# Patient Record
Sex: Male | Born: 1995 | Hispanic: No | Marital: Married | Smoking: Current every day smoker
Health system: Southern US, Community
[De-identification: ages and names within clinical notes are randomized; demographics above are authoritative.]

## PROBLEM LIST (undated history)

## (undated) ENCOUNTER — Ambulatory Visit (HOSPITAL_COMMUNITY): Admission: EM | Payer: Self-pay

## (undated) DIAGNOSIS — E119 Type 2 diabetes mellitus without complications: Secondary | ICD-10-CM

## (undated) DIAGNOSIS — E669 Obesity, unspecified: Secondary | ICD-10-CM

## (undated) HISTORY — DX: Type 2 diabetes mellitus without complications: E11.9

---
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
## 2015-08-01 ENCOUNTER — Encounter (HOSPITAL_COMMUNITY): Payer: Self-pay | Admitting: Emergency Medicine

## 2015-08-01 ENCOUNTER — Emergency Department (INDEPENDENT_AMBULATORY_CARE_PROVIDER_SITE_OTHER)
Admission: EM | Admit: 2015-08-01 | Discharge: 2015-08-01 | Disposition: A | Discharge status: Home / Self Care | Payer: Self-pay | Source: Home / Self Care | Attending: Emergency Medicine | Admitting: Emergency Medicine

## 2015-08-01 DIAGNOSIS — R29898 Other symptoms and signs involving the musculoskeletal system: Secondary | ICD-10-CM

## 2015-08-01 DIAGNOSIS — G5692 Unspecified mononeuropathy of left upper limb: Secondary | ICD-10-CM

## 2015-08-01 DIAGNOSIS — M6289 Other specified disorders of muscle: Secondary | ICD-10-CM

## 2015-08-01 NOTE — ED Notes (Signed)
Left hand issue, no known injury.  Reports numbness, tingling, weakness in left forearm to fingertips.  Reports inability to make a fist, reports he is only able to move thumb.

## 2015-08-01 NOTE — Discharge Instructions (Signed)
Peripheral Neuropathy °Peripheral neuropathy is a type of nerve damage. It affects nerves that carry signals between the spinal cord and other parts of the body. These are called peripheral nerves. With peripheral neuropathy, one nerve or a group of nerves may be damaged.  °CAUSES  °Many things can damage peripheral nerves. For some people with peripheral neuropathy, the cause is unknown. Some causes include: °· Diabetes. This is the most common cause of peripheral neuropathy. °· Injury to a nerve. °· Pressure or stress on a nerve that lasts a long time. °· Too little vitamin B. Alcoholism can lead to this. °· Infections. °· Autoimmune diseases, such as multiple sclerosis and systemic lupus erythematosus. °· Inherited nerve diseases. °· Some medicines, such as cancer drugs. °· Toxic substances, such as lead and mercury. °· Too little blood flowing to the legs. °· Kidney disease. °· Thyroid disease. °SIGNS AND SYMPTOMS  °Different people have different symptoms. The symptoms you have will depend on which of your nerves is damaged.  Common symptoms include: °· Loss of feeling (numbness) in the feet and hands. °· Tingling in the feet and hands. °· Pain that burns. °· Very sensitive skin. °· Weakness. °· Not being able to move a part of the body (paralysis). °· Muscle twitching. °· Clumsiness or poor coordination. °· Loss of balance. °· Not being able to control your bladder. °· Feeling dizzy. °· Sexual problems. °DIAGNOSIS  °Peripheral neuropathy is a symptom, not a disease. Finding the cause of peripheral neuropathy can be hard. To figure that out, your health care provider will take a medical history and do a physical exam. A neurological exam will also be done. This involves checking things affected by your brain, spinal cord, and nerves (nervous system). For example, your health care provider will check your reflexes, how you move, and what you can feel.  °Other types of tests may also be ordered, such as: °· Blood  tests. °· A test of the fluid in your spinal cord. °· Imaging tests, such as CT scans or an MRI. °· Electromyography (EMG). This test checks the nerves that control muscles. °· Nerve conduction velocity tests. These tests check how fast messages pass through your nerves. °· Nerve biopsy. A small piece of nerve is removed. It is then checked under a microscope. °TREATMENT  °· Medicine is often used to treat peripheral neuropathy. Medicines may include: °¨ Pain-relieving medicines. Prescription or over-the-counter medicine may be suggested. °¨ Antiseizure medicine. This may be used for pain. °¨ Antidepressants. These also may help ease pain from neuropathy. °¨ Lidocaine. This is a numbing medicine. You might wear a patch or be given a shot. °¨ Mexiletine. This medicine is typically used to help control irregular heart rhythms. °· Surgery. Surgery may be needed to relieve pressure on a nerve or to destroy a nerve that is causing pain. °· Physical therapy to help movement. °· Assistive devices to help movement. °HOME CARE INSTRUCTIONS  °· Only take over-the-counter or prescription medicines as directed by your health care provider. Follow the instructions carefully for any given medicines. Do not take any other medicines without first getting approval from your health care provider. °· If you have diabetes, work closely with your health care provider to keep your blood sugar under control. °· If you have numbness in your feet: °¨ Check every day for signs of injury or infection. Watch for redness, warmth, and swelling. °¨ Wear padded socks and comfortable shoes. These help protect your feet. °· Do not do   things that put pressure on your damaged nerve.  Do not smoke. Smoking keeps blood from getting to damaged nerves.  Avoid or limit alcohol. Too much alcohol can cause a lack of B vitamins. These vitamins are needed for healthy nerves.  Develop a good support system. Coping with peripheral neuropathy can be  stressful. Talk to a mental health specialist or join a support group if you are struggling.  Follow up with your health care provider as directed. SEEK MEDICAL CARE IF:   You have new signs or symptoms of peripheral neuropathy.  You are struggling emotionally from dealing with peripheral neuropathy.  You have a fever. SEEK IMMEDIATE MEDICAL CARE IF:   You have an injury or infection that is not healing.  You feel very dizzy or begin vomiting.  You have chest pain.  You have trouble breathing. Document Released: 11/01/2002 Document Revised: 07/24/2011 Document Reviewed: 07/19/2013 [facility] Patient Information 2015 [place], [place]. This information is not intended to replace advice given to you by your health care provider. Make sure you discuss any questions you have with your health care provider.  Radial Nerve Palsy Wrist drop is also known as radial nerve palsy. It is a condition in which you can not extend your wrist. This means if you are standing with your elbow bent at a right angle and with the top of your hand pointed at the ceiling, you can not hold your hand up. It falls toward the floor.  This action of extending your wrist is caused by the muscles in the back of your arm. These muscles are controlled by the radial nerve. This means that anything affecting the radial nerve so it can not tell the muscles how to work will cause wrist drop. This is medically called radial nerve palsy. Also the radial nerve is a motor and sensory nerve so anything affecting it causes problems with movement and feeling. CAUSES  Some more common causes of wrist drop are:  A break (fracture) of the large bone in the arm between your shoulder and your elbow (humerus). This is because the radial nerve winds around the humerus.  Improper use of crutches causes this because the radial nerve runs through the armpit (axilla). Crutches which are too long can put pressure on the nerve. This is sometimes  called crutch palsy.  Falling asleep with your arm over a chair and supported on the back is a common cause. This is sometimes called Saturday Night Syndrome.  Wrist drop can be associated with lead poisoning because of the effect of lead on the radial nerve. SYMPTOMS  The wrist drop is an obvious problem, but there may also be numbness in the back of the arm, forearm or hand which provides feeling in these areas by the radial nerve. There can be difficulty straightening out the elbow in addition to the wrist. There may be numbness, tingling, pain, burning sensations or other abnormal feelings. Symptoms depend entirely on where the radial nerve is injured. DIAGNOSIS   Wrist drop is obvious just by looking at it. Your caregiver may make the diagnosis by taking your history and doing a couple tests.  One test which may be done is a nerve conduction study. This test shows if the radial nerve is conducting signals well. If not, it can determine where the nerve problem is.  Sometimes X-ray studies are done. Your caregiver will determine if further testing needs to be done. TREATMENT   Usually if the problem is found to be  pressure on the nerve, simply removing the pressure will allow the nerve to go back to normal in a few weeks to a few months. Other treatments will depend upon the cause found.  Only take over-the-counter or prescription medicines for pain, discomfort, or fever as directed by your caregiver.  Sometimes seizure medications are used.  Steroids are sometimes given to decrease swelling if it is thought to be a possible cause. Document Released: 07/18/2006 Document Revised: 02/03/2012 Document Reviewed: 01/26/2014 [facility] Patient Information 2015 [place], [place]. This information is not intended to replace advice given to you by your health care provider. Make sure you discuss any questions you have with your health care provider.

## 2015-08-01 NOTE — ED Provider Notes (Signed)
CSN: [id number]     Arrival date & time 08/01/15  1301 History   First MD Initiated Contact with Patient 08/01/15 1333     Chief Complaint  Patient presents with  . Hand Problem   (Consider location/radiation/quality/duration/timing/severity/associated sxs/prior Treatment) HPI Comments: 19 year old male complaining of left hand weakness and occasional numbness. He states that approximately 4 months ago he started developing numbness in his hand and lesser to his fingers. Last night as he was sleeping he awoke and had numbness in his left arm and was unable to flex his fingers to make a fist. He is still unable to do that this morning. He felt as though his arm went numb and became weak as he was sleeping on his arm last night. He is also experienced similar symptoms to his right upper extremity. He states he is not working because he quit his job due to left hand weakness. Denies any known injury. No trauma. He also states that it is worse when he is driving with his left hand.   History reviewed. No pertinent past medical history. History reviewed. No pertinent past surgical history. No family history on file. Social History  Substance Use Topics  . Smoking status: Current Some Day Smoker -- 4.00 packs/day    Types: Cigarettes  . Smokeless tobacco: None  . Alcohol Use: None     Comment: occasionally drinking    Review of Systems  Constitutional: Positive for activity change. Negative for fever and fatigue.  HENT: Negative.   Respiratory: Negative.   Musculoskeletal:       As per history of present illness  Skin: Negative for color change, pallor and rash.  Neurological: Positive for numbness. Negative for syncope and speech difficulty.  All other systems reviewed and are negative.   Allergies  Review of patient's allergies indicates no known allergies.  Home Medications   Prior to Admission medications   Not on File   Meds Ordered and Administered this Visit  Medications -  No data to display  BP 132/89 mmHg  Pulse 109  Temp(Src) 97.9 F (36.6 C) (Oral)  Resp 16  SpO2 97% No data found.   Physical Exam  Constitutional: He appears well-developed and well-nourished. No distress.  Neck: Normal range of motion. Neck supple.  Pulmonary/Chest: Effort normal. No respiratory distress.  Musculoskeletal: He exhibits no edema.  Strength of the left arm and wrist is 5 over 5. Generally weak area are his fingers in which she has weakness in making a fist.  left hand with normal warmth and color. No pallor. Capillary refill is brisk. There is no atrophy of the left forearm wrist or hand. Radial pulse is 2+.  Negative Phalen's negative Tinel's. No tenderness to the forearm although occasionally pain will radiate to the ulnar aspect of the forearm.  There is tenderness to the ulnar aspect of the wrist. No swelling, deformity, discoloration or other recognizable abnormality.  Neurological: He is alert. He exhibits normal muscle tone.  Skin: Skin is warm and dry.  Psychiatric: He has a normal mood and affect.  Nursing note and vitals reviewed.   ED Course  Procedures (including critical care time)  Labs Review Labs Reviewed - No data to display  Imaging Review No results found.   Visual Acuity Review  Right Eye Distance:   Left Eye Distance:   Bilateral Distance:    Right Eye Near:   Left Eye Near:    Bilateral Near:  MDM   1. Left hand weakness   2. Neuropathy of hand, left    Uncertain as to the etiology of the gradual onset of the weakness and pain to the left distal upper extremity. There are signs and symptoms of tendinitis as well as "Saturday night palsy". He frequently sleeps on his side and therefore his arm and will often awake with a distal pressure neuropathy. This apparently occurred last night and his description is typical of this type of condition. Very little evidence for carpal tunnel syndrome. We will have him apply a wrist  splint. He may remove it periodically and exercises fingers and wrist. Follow-up with urologist as needed. Also instructed to obtain a primary care physician.  Hayden Rasmussen, NP 08/01/15 1359

## 2015-12-12 ENCOUNTER — Encounter (HOSPITAL_COMMUNITY): Payer: Self-pay | Admitting: Emergency Medicine

## 2015-12-12 ENCOUNTER — Emergency Department (HOSPITAL_COMMUNITY)
Admission: EM | Admit: 2015-12-12 | Discharge: 2015-12-12 | Disposition: A | Discharge status: Home / Self Care | Payer: Medicaid Other | Attending: Emergency Medicine | Admitting: Emergency Medicine

## 2015-12-12 DIAGNOSIS — M542 Cervicalgia: Secondary | ICD-10-CM

## 2015-12-12 DIAGNOSIS — M25511 Pain in right shoulder: Secondary | ICD-10-CM | POA: Insufficient documentation

## 2015-12-12 DIAGNOSIS — M546 Pain in thoracic spine: Secondary | ICD-10-CM | POA: Insufficient documentation

## 2015-12-12 DIAGNOSIS — F17210 Nicotine dependence, cigarettes, uncomplicated: Secondary | ICD-10-CM | POA: Insufficient documentation

## 2015-12-12 MED ORDER — METHOCARBAMOL 500 MG PO TABS: 500.0000 mg | ORAL_TABLET | Freq: Two times a day (BID) | ORAL | Status: DC | PRN

## 2015-12-12 MED ORDER — NAPROXEN 250 MG PO TABS: 250.0000 mg | ORAL_TABLET | Freq: Two times a day (BID) | ORAL | Status: DC

## 2015-12-12 NOTE — ED Provider Notes (Signed)
CSN: [id number]     Arrival date & time 12/12/15  0704 History   First MD Initiated Contact with Patient 12/12/15 0720     Chief Complaint  Patient presents with  . Neck Pain  . Back Pain   Alexander Mccann is a 20 y.o. male who presents to the emergency department complaining of right-sided shoulder and neck pain ongoing for the past 2 days. Patient denies any injury or trauma. He reports he began having pain yesterday and his pain is worse with movement of his neck and arm. He complains of 3 out of 10 pain currently which is worse with movement. He has taken nothing for treatment today. He denies any falls or injury. He denies fevers, numbness, tingling, weakness, headache, sore throat, trouble swallowing.   (Consider location/radiation/quality/duration/timing/severity/associated sxs/prior Treatment) HPI  History reviewed. No pertinent past medical history. History reviewed. No pertinent past surgical history. History reviewed. No pertinent family history. Social History  Substance Use Topics  . Smoking status: Current Some Day Smoker -- 4.00 packs/day    Types: Cigarettes  . Smokeless tobacco: None  . Alcohol Use: None     Comment: occasionally drinking    Review of Systems  Constitutional: Negative for fever and chills.  HENT: Negative for congestion, sore throat and trouble swallowing.   Eyes: Negative for visual disturbance.  Respiratory: Negative for cough and shortness of breath.   Cardiovascular: Negative for chest pain and palpitations.  Gastrointestinal: Negative for nausea, vomiting and abdominal pain.  Musculoskeletal: Positive for neck pain. Negative for back pain.  Skin: Negative for rash and wound.  Neurological: Negative for weakness, light-headedness, numbness and headaches.      Allergies  Review of patient's allergies indicates no known allergies.  Home Medications   Prior to Admission medications   Medication Sig Start Date End Date Taking? Authorizing  Provider  methocarbamol (ROBAXIN) 500 MG tablet Take 1 tablet (500 mg total) by mouth 2 (two) times daily as needed for muscle spasms. 12/12/15   Everlene Farrier, PA-C  naproxen (NAPROSYN) 250 MG tablet Take 1 tablet (250 mg total) by mouth 2 (two) times daily with a meal. 12/12/15   Everlene Farrier, PA-C   BP 158/104 mmHg  Pulse 86  Temp(Src) 98.2 F (36.8 C) (Oral)  Resp 18  SpO2 97% Physical Exam  Constitutional: He appears well-developed and well-nourished. No distress.  Nontoxic appearing.  HENT:  Head: Normocephalic and atraumatic.  Right Ear: External ear normal.  Left Ear: External ear normal.  Mouth/Throat: Oropharynx is clear and moist. No oropharyngeal exudate.  Bilateral tympanic membranes are pearly-gray without erythema or loss of landmarks.  No tonsillar hypertrophy or exudates. Throat is clear.  Eyes: Conjunctivae are normal. Pupils are equal, round, and reactive to light. Right eye exhibits no discharge. Left eye exhibits no discharge.  Neck: Normal range of motion. Neck supple. No JVD present. No tracheal deviation present.  He has good range of motion of his neck. He has tenderness along his right trapezius muscle. He is able to place his chin to his chest and look up to the ceiling. He is able to turn his head greater than 45 in each direction. No meningeal signs. No midline neck tenderness. No crepitus or deformity.  Cardiovascular: Normal rate, regular rhythm, normal heart sounds and intact distal pulses.  Exam reveals no gallop and no friction rub.   No murmur heard. Bilateral radial pulses are intact.  Pulmonary/Chest: Effort normal and breath sounds normal. No respiratory distress. He  has no wheezes. He has no rales.  Abdominal: Soft. There is no tenderness.  Musculoskeletal: Normal range of motion. He exhibits no edema or tenderness.  Patient is getting normal range of motion of his bilateral upper extremities. No bony point tenderness. No midline back tenderness.  He is able to ambulate with normal gait. 5 out of 5 strength in his bilateral upper extremities.  Lymphadenopathy:    He has no cervical adenopathy.  Neurological: He is alert. Coordination normal.  Sensation is intact his bilateral upper extremities. Normal gait.  Skin: Skin is warm and dry. No rash noted. He is not diaphoretic. No erythema. No pallor.  Psychiatric: He has a normal mood and affect. His behavior is normal.  Nursing note and vitals reviewed.   ED Course  Procedures (including critical care time) Labs Review Labs Reviewed - No data to display  Imaging Review No results found.    EKG Interpretation None      Filed Vitals:   12/12/15 0716  BP: 158/104  Pulse: 86  Temp: 98.2 F (36.8 C)  TempSrc: Oral  Resp: 18  SpO2: 97%     MDM   Meds given in ED:  Medications - No data to display  Discharge Medication List as of 12/12/2015  7:30 AM    START taking these medications   Details  methocarbamol (ROBAXIN) 500 MG tablet Take 1 tablet (500 mg total) by mouth 2 (two) times daily as needed for muscle spasms., Starting 12/12/2015, Until Discontinued, Print    naproxen (NAPROSYN) 250 MG tablet Take 1 tablet (250 mg total) by mouth 2 (two) times daily with a meal., Starting 12/12/2015, Until Discontinued, Print        Final diagnoses:  Neck pain on right side   This  is a 20 y.o. male who presents to the emergency department complaining of right-sided shoulder and neck pain ongoing for the past 2 days. Patient denies any injury or trauma. He reports he began having pain yesterday and his pain is worse with movement of his neck and arm. He complains of 3 out of 10 pain currently which is worse with movement. On exam the patient is afebrile nontoxic appearing. He has no focal neurological deficits. He has tenderness over his right trapezius muscle. Patient with neck muscle sprain. Will start the patient on Robaxin and naproxen and have him follow up closely with  primary care. No meningeal signs. No need for x-ray at this time. I advised the patient to follow-up with their primary care provider this week. I advised the patient to return to the emergency department with new or worsening symptoms or new concerns. The patient verbalized understanding and agreement with plan.       Everlene Farrier, PA-C 12/12/15 8119  Mancel Bale, MD 12/12/15 [phone]

## 2015-12-12 NOTE — Discharge Instructions (Signed)
Acute Torticollis °Torticollis is a condition in which the muscles of the neck tighten (contract) abnormally, causing the neck to twist and the head to move into an unnatural position. Torticollis that develops suddenly is called acute torticollis. If torticollis becomes chronic and is left untreated, the face and neck can become deformed. °CAUSES °This condition may be caused by: °· Sleeping in an awkward position (common). °· Extending or twisting the neck muscles beyond their normal position. °· Infection. °In some cases, the cause may not be known. °SYMPTOMS °Symptoms of this condition include: °· An unnatural position of the head. °· Neck pain. °· A limited ability to move the neck. °· Twisting of the neck to one side. °DIAGNOSIS °This condition is diagnosed with a physical exam. You may also have imaging tests, such as an X-ray, CT scan, or MRI. °TREATMENT °Treatment for this condition involves trying to relax the neck muscles. It may include: °· Medicines or shots. °· Physical therapy. °· Surgery. This may be done in severe cases. °HOME CARE INSTRUCTIONS °· Take medicines only as directed by your health care provider. °· Do stretching exercises and massage your neck as directed by your health care provider. °· Keep all follow-up visits as directed by your health care provider. This is important. °SEEK MEDICAL CARE IF: °· You develop a fever. °SEEK IMMEDIATE MEDICAL CARE IF: °· You develop difficulty breathing. °· You develop noisy breathing (stridor). °· You start drooling. °· You have trouble swallowing or have pain with swallowing. °· You develop numbness or weakness in your hands or feet. °· You have changes in your speech, understanding, or vision. °· Your pain gets worse. °  °This information is not intended to replace advice given to you by your health care provider. Make sure you discuss any questions you have with your health care provider. °  °Document Released: 11/08/2000 Document Revised:  03/28/2015 Document Reviewed: 11/07/2014 °Elsevier Interactive Patient Education ©2016 [employer] ° °

## 2015-12-12 NOTE — ED Notes (Signed)
Pt sts right sided upper back and neck pain x 2 days worse with movement; pt denies obvious injury

## 2016-09-20 ENCOUNTER — Encounter (HOSPITAL_COMMUNITY): Payer: Self-pay | Admitting: Emergency Medicine

## 2016-09-20 DIAGNOSIS — W108XXA Fall (on) (from) other stairs and steps, initial encounter: Secondary | ICD-10-CM | POA: Insufficient documentation

## 2016-09-20 DIAGNOSIS — Y9389 Activity, other specified: Secondary | ICD-10-CM | POA: Insufficient documentation

## 2016-09-20 DIAGNOSIS — F17210 Nicotine dependence, cigarettes, uncomplicated: Secondary | ICD-10-CM | POA: Insufficient documentation

## 2016-09-20 DIAGNOSIS — S300XXA Contusion of lower back and pelvis, initial encounter: Secondary | ICD-10-CM | POA: Insufficient documentation

## 2016-09-20 DIAGNOSIS — Y999 Unspecified external cause status: Secondary | ICD-10-CM | POA: Insufficient documentation

## 2016-09-20 DIAGNOSIS — Y92009 Unspecified place in unspecified non-institutional (private) residence as the place of occurrence of the external cause: Secondary | ICD-10-CM | POA: Insufficient documentation

## 2016-09-20 NOTE — ED Triage Notes (Signed)
Pt. fell today at home , reports pain at lower back , denies LOC / ambulatory , no  hematuria or dysuria .

## 2016-09-21 ENCOUNTER — Emergency Department (HOSPITAL_COMMUNITY)
Admission: EM | Admit: 2016-09-21 | Discharge: 2016-09-21 | Disposition: A | Discharge status: Home / Self Care | Payer: Self-pay | Attending: Emergency Medicine | Admitting: Emergency Medicine

## 2016-09-21 ENCOUNTER — Emergency Department (HOSPITAL_COMMUNITY): Payer: Self-pay

## 2016-09-21 DIAGNOSIS — S300XXA Contusion of lower back and pelvis, initial encounter: Secondary | ICD-10-CM

## 2016-09-21 DIAGNOSIS — M4310 Spondylolisthesis, site unspecified: Secondary | ICD-10-CM

## 2016-09-21 HISTORY — DX: Obesity, unspecified: E66.9

## 2016-09-21 MED ORDER — METHOCARBAMOL 500 MG PO TABS: 500.0000 mg | ORAL_TABLET | Freq: Two times a day (BID) | ORAL | 0 refills | Status: DC | PRN

## 2016-09-21 MED ORDER — CYCLOBENZAPRINE HCL 10 MG PO TABS: 10.0000 mg | ORAL_TABLET | Freq: Two times a day (BID) | ORAL | 0 refills | Status: DC | PRN

## 2016-09-21 MED ORDER — IBUPROFEN 400 MG PO TABS
800.0000 mg | ORAL_TABLET | Freq: Once | ORAL | Status: AC
  Administered 2016-09-21: 800 mg via ORAL
  Filled 2016-09-21: qty 2

## 2016-09-21 MED ORDER — NAPROXEN 250 MG PO TABS: 250.0000 mg | ORAL_TABLET | Freq: Two times a day (BID) | ORAL | 0 refills | Status: DC

## 2016-09-21 NOTE — ED Provider Notes (Signed)
[facility]-EMERGENCY DEPT Provider Note   CSN: [id number] Arrival date & time: 09/20/16  2145     History   Chief Complaint Chief Complaint  Patient presents with  . Back Pain    HPI Alexander Mccann is a 20 y.o. male who presents to the ED with low back pain. 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 He landed on his back and has continued to have pain in the lower back. He denies other injuries from the fall.   HPI  Past Medical History:  Diagnosis Date  . Obesity     Patient Active Problem List   Diagnosis Date Noted  . VIRAL INFECTION 01/19/2008  . ACUTE PHARYNGITIS 01/19/2008  . MORBID OBESITY 01/08/2008  . INGROWN TOENAIL 01/08/2008  . TINEA VERSICOLOR 08/04/2007  . HYPOGONADISM 08/04/2007  . BRONCHITIS NOS 08/04/2007    History reviewed. No pertinent surgical history.     Home Medications    Prior to Admission medications   Medication Sig Start Date End Date Taking? Authorizing Provider  cyclobenzaprine (FLEXERIL) 10 MG tablet Take 1 tablet (10 mg total) by mouth 2 (two) times daily as needed for muscle spasms. 09/21/16   Hope Orlene OchM Neese, NP  methocarbamol (ROBAXIN) 500 MG tablet Take 1 tablet (500 mg total) by mouth 2 (two) times daily as needed for muscle spasms. 09/21/16   Marlon Peliffany Greene, PA-C  naproxen (NAPROSYN) 250 MG tablet Take 1 tablet (250 mg total) by mouth 2 (two) times daily with a meal. 09/21/16   Marlon Peliffany Greene, PA-C    Family History No family history on file.  Social History Social History  Substance Use Topics  . Smoking status: Current Some Day Smoker    Types: Cigarettes  . Smokeless tobacco: Never Used  . Alcohol use Not on file     Comment: occasionally drinking     Allergies   Review of patient's allergies indicates no known allergies.   Review of Systems Review of Systems  HENT: Negative.   Eyes: Negative for visual disturbance.  Respiratory: Positive for cough.   Gastrointestinal: Negative for  abdominal pain, nausea and vomiting.  Genitourinary: Negative for dysuria, frequency and hematuria.  Musculoskeletal: Positive for back pain. Negative for neck pain.  Skin: Negative for wound.  Neurological: Negative for syncope and headaches.  Psychiatric/Behavioral: Negative for confusion.     Physical Exam Updated Vital Signs BP (!) 142/101 (BP Location: Right Arm)   Pulse 92   Temp 98.6 F (37 C) (Oral)   Resp 18   Ht 5\' 7"  (1.702 m)   Wt 132.9 kg   SpO2 97%   BMI 45.89 kg/m   Physical Exam  Constitutional: He is oriented to person, place, and time. He appears well-developed and well-nourished. No distress.  HENT:  Head: Normocephalic and atraumatic.  Eyes: EOM are normal. Pupils are equal, round, and reactive to light.  Neck: Normal range of motion. Neck supple.  Cardiovascular: Normal rate and regular rhythm.   Pulmonary/Chest: Effort normal. No respiratory distress. He has no wheezes. He has no rales.  Abdominal: Soft. Bowel sounds are normal. There is no tenderness.  Musculoskeletal: Normal range of motion. He exhibits no edema.       Lumbar back: He exhibits tenderness. He exhibits normal range of motion, no deformity, no spasm and normal pulse.  Neurological: He is alert and oriented to person, place, and time. He has normal strength. No cranial nerve deficit or sensory deficit. Coordination and gait normal.  Reflex Scores:      Bicep reflexes are 2+ on the right side and 2+ on the left side.      Brachioradialis reflexes are 2+ on the right side and 2+ on the left side.      Patellar reflexes are 2+ on the right side and 2+ on the left side.      Achilles reflexes are 2+ on the right side and 2+ on the left side. Skin: Skin is warm and dry.  Psychiatric: He has a normal mood and affect. His behavior is normal.  Nursing note and vitals reviewed.    ED Treatments / Results  Labs (all labs ordered are listed, but only abnormal results are displayed) Labs  Reviewed - No data to display   Radiology Dg Lumbar Spine Complete  Result Date: 09/21/2016 CLINICAL DATA:  20 year old male with fall and lower back pain. EXAM: LUMBAR SPINE - COMPLETE 4+ VIEW COMPARISON:  None. FINDINGS: No definite acute fracture. There is grade II L5-S1 retrolisthesis. No definite pars defects identified on these radiographs but findings may be related to pars defects. CT or MRI is recommended if there is clinical concern for an acute injury. The vertebral body heights and disc spaces (proximal to L5-S1) are maintained. The visualized transverse and spinous processes appear intact. The soft tissues are grossly unremarkable. IMPRESSION: No definite acute fracture. Grade II L5-S1 retrolisthesis. Clinical correlation is recommended. CT or MRI may provide better evaluation if there is clinical concern for an acute injury. Electronically Signed   By: Elgie CollardArash  Radparvar M.D.   On: 09/21/2016 02:48    Procedures Procedures (including critical care time)  Medications Ordered in ED Medications  ibuprofen (ADVIL,MOTRIN) tablet 800 mg (800 mg Oral Given 09/21/16 0036)     Initial Impression / Assessment and Plan / ED Course  I have reviewed the triage vital signs and the nursing notes.  Clinical Course  20 y.o. male with back pain s/p fall stable for d/c to f/u with ortho for further evaluation of back pain and retrolisthesis. No neuro deficits.  Final Clinical Impressions(s) / ED Diagnoses   Final diagnoses:  Contusion of lower back, initial encounter  Retrolisthesis    New Prescriptions Discharge Medication List as of 09/21/2016  3:04 AM    START taking these medications   Details  cyclobenzaprine (FLEXERIL) 10 MG tablet Take 1 tablet (10 mg total) by mouth 2 (two) times daily as needed for muscle spasms., Starting Sat 09/21/2016, Print         [place] Ne[place], NP 09/22/16 1547    Gilda Creasehristopher J Pollina, MD 09/23/16 [phone]

## 2017-06-08 ENCOUNTER — Encounter (HOSPITAL_COMMUNITY): Payer: Self-pay | Admitting: *Deleted

## 2017-06-08 ENCOUNTER — Emergency Department (HOSPITAL_COMMUNITY)
Admission: EM | Admit: 2017-06-08 | Discharge: 2017-06-08 | Disposition: A | Discharge status: Home / Self Care | Payer: Medicaid Other | Attending: Emergency Medicine | Admitting: Emergency Medicine

## 2017-06-08 DIAGNOSIS — R030 Elevated blood-pressure reading, without diagnosis of hypertension: Secondary | ICD-10-CM | POA: Insufficient documentation

## 2017-06-08 DIAGNOSIS — F17210 Nicotine dependence, cigarettes, uncomplicated: Secondary | ICD-10-CM | POA: Insufficient documentation

## 2017-06-08 DIAGNOSIS — B078 Other viral warts: Secondary | ICD-10-CM | POA: Insufficient documentation

## 2017-06-08 DIAGNOSIS — R0789 Other chest pain: Secondary | ICD-10-CM

## 2017-06-08 DIAGNOSIS — Z79899 Other long term (current) drug therapy: Secondary | ICD-10-CM | POA: Insufficient documentation

## 2017-06-08 DIAGNOSIS — G8929 Other chronic pain: Secondary | ICD-10-CM | POA: Insufficient documentation

## 2017-06-08 NOTE — ED Provider Notes (Signed)
[facility]-EMER[facility]Y DEPT Provider Note   CSN: [id number] Arrival date & time: 06/08/17  [id number]     History   Chief Complaint Chief Complaint  Patient presents with  . Back Pain  . Chest Pain    HPI Alexander Mccann is a 21 y.o. male.Complains of low back pain, nonradiating at paralumbar area for one year. In the past he's treated back pain with topical ointment and with a back brace with partial relief he has not used either the ointment or the back brace recently. He states "I don't like to take pills" pain is worse with bending improved with remaining still. Nonradiating. No fever no loss of bladder or bowel control. He also complains of anterior chest pain covering a 1 cm area over his left anterior chest for 6 months lasting 5 minutes at a time he states is worse with drinking Coke and improved with drinking Sprite he has no shortness of breath no nausea no sweatiness pain is not made worse with exertion. No other associated symptoms. He also reports warts on his hands for one month for which she is requesting medications. No symptoms presently.  HPI  Past Medical History:  Diagnosis Date  . Obesity     Patient Active Problem List   Diagnosis Date Noted  . VIRAL INFECTION 01/19/2008  . ACUTE PHARYNGITIS 01/19/2008  . MORBID OBESITY 01/08/2008  . INGROWN TOENAIL 01/08/2008  . TINEA VERSICOLOR 08/04/2007  . HYPOGONADISM 08/04/2007  . BRONCHITIS NOS 08/04/2007    History reviewed. No pertinent surgical history.     Home Medications    Prior to Admission medications   Medication Sig Start Date End Date Taking? Authorizing Provider  cyclobenzaprine (FLEXERIL) 10 MG tablet Take 1 tablet (10 mg total) by mouth 2 (two) times daily as needed for muscle spasms. 09/21/16   Janne NapoleonNeese, Hope M, NP  methocarbamol (ROBAXIN) 500 MG tablet Take 1 tablet (500 mg total) by mouth 2 (two) times daily as needed for muscle spasms. 09/21/16   Marlon PelGreene, Tiffany, PA-C  naproxen (NAPROSYN) 250 MG tablet  Take 1 tablet (250 mg total) by mouth 2 (two) times daily with a meal. 09/21/16   Marlon PelGreene, Tiffany, PA-C    Family History No family history on file. Family history negative for cardiac disease Social History Social History  Substance Use Topics  . Smoking status: Current Some Day Smoker    Packs/day: 1.50    Types: Cigarettes  . Smokeless tobacco: Never Used  . Alcohol use Not on file     Comment: occasionally drinking     Allergies   Patient has no known allergies.   Review of Systems Review of Systems  Cardiovascular: Positive for chest pain.  Musculoskeletal: Positive for back pain.  Skin:       Warts on hands  All other systems reviewed and are negative.    Physical Exam Updated Vital Signs BP (!) 142/83   Pulse 82   Temp 98 F (36.7 C) (Oral)   Resp 19   Ht 5\' 8"  (1.727 m)   Wt 127 kg (280 lb)   SpO2 97%   BMI 42.57 kg/m   Physical Exam  Constitutional: He appears well-developed and well-nourished.  HENT:  Head: Normocephalic and atraumatic.  Eyes: Pupils are equal, round, and reactive to light. Conjunctivae are normal.  Neck: Neck supple. No tracheal deviation present. No thyromegaly present.  Cardiovascular: Normal rate, regular rhythm, normal heart sounds and intact distal pulses.   No murmur heard. Pulmonary/Chest: Effort normal  and breath sounds normal.  Abdominal: Soft. Bowel sounds are normal. He exhibits no distension. There is no tenderness.  Obese  Musculoskeletal: Normal range of motion. He exhibits no edema or tenderness.  Neurological: He is alert. Coordination normal.  Skin: Skin is warm and dry. Capillary refill takes less than 2 seconds. No rash noted.  Tiny warts on several fingers of both hands. Otherwise no rash  Psychiatric: He has a normal mood and affect.  Nursing note and vitals reviewed.    ED Treatments / Results  Labs (all labs ordered are listed, but only abnormal results are displayed) Labs Reviewed - No data to  display  EKG  EKG Interpretation  Date/Time:  Sunday June 08 2017 07:38:58 EDT Ventricular Rate:  83 PR Interval:  150 QRS Duration: 102 QT Interval:  368 QTC Calculation: 432 R Axis:   16 Text Interpretation:  Normal sinus rhythm Incomplete right bundle branch block Borderline ECG No old tracing to compare Confirmed by Basehor, Doreatha Martin [phone]) on 06/08/2017 8:00:52 AM       Radiology No results found.  Procedures Procedures (including critical care time)  Medications Ordered in ED Medications - No data to display   Initial Impression / Assessment and Plan / ED Course  I have reviewed the triage vital signs and the nursing notes.  Pertinent labs & imaging results that were available during my care of the patient were reviewed by me and considered in my medical decision making (see chart for details).     Chest pain is highly atypical for cardiac disease. Highly atypical for pulmonary embolism or other serious ailments he is asymptomatic presently. Suggest Tylenol for pain. I see hot for back pain. Topical over-the-counter medication for warts. I've referred him to primary care. I counseled patient for 5 minutes on smoking cessation. Blood pressure recheck 3 weeks  Final Clinical Impressions(s) / ED Diagnoses  Diagnosis #1 chronic back pain #2 atypical chest pain #3warts #4 tobacco abuse #5 elevated blood pressure Final diagnoses:  Atypical chest pain    New Prescriptions New Prescriptions   No medications on file     Doug Sou, MD 06/08/17 [phone]

## 2017-06-08 NOTE — ED Triage Notes (Signed)
Pt with multiple complaints.  Initially stated lower back pain that he was tx for 1 year ago and was told to go  "get a shot", but he didn't go.  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

## 2017-06-08 NOTE — Discharge Instructions (Signed)
Take Tylenol for pain. Ask the your local pharmacy for over-the-counter medication that he can place on the warts on your hands. Compound W is a possibility You can use icy hot on your back as needed. Call any of the numbers on these discharge instructions to get a primary care physician. Ask your new primary  care physician to help you to stop smoking as smoking can lead to heart disease lung disease and cancer. Get your blood pressure recheck within the next 3 weeks. Today's was mildly elevated at 142/83

## 2017-09-02 DIAGNOSIS — R0602 Shortness of breath: Secondary | ICD-10-CM | POA: Insufficient documentation

## 2017-09-02 DIAGNOSIS — R072 Precordial pain: Secondary | ICD-10-CM | POA: Insufficient documentation

## 2017-09-02 DIAGNOSIS — Z87891 Personal history of nicotine dependence: Secondary | ICD-10-CM | POA: Insufficient documentation

## 2017-09-02 DIAGNOSIS — R739 Hyperglycemia, unspecified: Secondary | ICD-10-CM | POA: Insufficient documentation

## 2017-09-03 ENCOUNTER — Emergency Department (HOSPITAL_COMMUNITY)
Admission: EM | Admit: 2017-09-03 | Discharge: 2017-09-03 | Disposition: A | Discharge status: Home / Self Care | Payer: Self-pay | Attending: Emergency Medicine | Admitting: Emergency Medicine

## 2017-09-03 ENCOUNTER — Emergency Department (HOSPITAL_COMMUNITY): Payer: Self-pay

## 2017-09-03 ENCOUNTER — Encounter (HOSPITAL_COMMUNITY): Payer: Self-pay | Admitting: Emergency Medicine

## 2017-09-03 DIAGNOSIS — R739 Hyperglycemia, unspecified: Secondary | ICD-10-CM

## 2017-09-03 DIAGNOSIS — R072 Precordial pain: Secondary | ICD-10-CM

## 2017-09-03 LAB — CBC
HEMATOCRIT: 38.6 % — AB (ref 39.0–52.0)
HEMOGLOBIN: 13.2 g/dL (ref 13.0–17.0)
MCH: 28 pg (ref 26.0–34.0)
MCHC: 34.2 g/dL (ref 30.0–36.0)
MCV: 81.8 fL (ref 78.0–100.0)
Platelets: 226 10*3/uL (ref 150–400)
RBC: 4.72 MIL/uL (ref 4.22–5.81)
RDW: 13.2 % (ref 11.5–15.5)
WBC: 8.4 10*3/uL (ref 4.0–10.5)

## 2017-09-03 LAB — BASIC METABOLIC PANEL
ANION GAP: 7 (ref 5–15)
BUN: 10 mg/dL (ref 6–20)
CHLORIDE: 101 mmol/L (ref 101–111)
CO2: 26 mmol/L (ref 22–32)
Calcium: 8.9 mg/dL (ref 8.9–10.3)
Creatinine, Ser: 0.67 mg/dL (ref 0.61–1.24)
GFR calc Af Amer: >60 mL/min (ref >60)
GFR calc non Af Amer: >60 mL/min (ref >60)
GLUCOSE: 457 mg/dL — AB (ref 65–99)
POTASSIUM: 3.9 mmol/L (ref 3.5–5.1)
Sodium: 134 mmol/L — ABNORMAL LOW (ref 135–145)

## 2017-09-03 LAB — I-STAT TROPONIN, ED: Troponin i, poc: 0 ng/mL (ref 0.00–0.08)

## 2017-09-03 LAB — CBG MONITORING, ED: GLUCOSE-CAPILLARY: 390 mg/dL — AB (ref 65–99)

## 2017-09-03 NOTE — Discharge Instructions (Signed)

## 2017-09-03 NOTE — ED Triage Notes (Signed)
Patient here with recurrent chest pain, states that the last time he was here he was told to quit smoking which he has and still continues with chest pain.  Patient states that it travels around his chest, sharp in nature.

## 2017-09-03 NOTE — ED Provider Notes (Signed)
[facility]-EMERGENCY DEPT Provider Note   CSN: [id number] Arrival date & time: 09/02/17  2341     History   Chief Complaint Chief Complaint  Patient presents with  . Chest Pain    HPI Alexander Mccann is a 21 y.o. male.  The history is provided by the patient.  Chest Pain   This is a new problem. The current episode started more than 2 days ago. The problem occurs daily. The problem has been gradually improving. The pain is moderate. The quality of the pain is described as burning. The pain does not radiate. Associated symptoms include shortness of breath. Pertinent negatives include no fever and no vomiting. He has tried nothing for the symptoms. Risk factors include obesity.   Patient with h/o obesity presents with chest pain He reports over past 3 days he has had diffuse chest pain, feels like hearburn at times It comes at random, not provoked by exertion  He has sharp CP at times He reports mild SOB No hemoptysis No h/o CAD/PE/DVT No travel He has quit smoking  Past Medical History:  Diagnosis Date  . Obesity     Patient Active Problem List   Diagnosis Date Noted  . VIRAL INFECTION 01/19/2008  . ACUTE PHARYNGITIS 01/19/2008  . MORBID OBESITY 01/08/2008  . INGROWN TOENAIL 01/08/2008  . TINEA VERSICOLOR 08/04/2007  . HYPOGONADISM 08/04/2007  . BRONCHITIS NOS 08/04/2007    History reviewed. No pertinent surgical history.     Home Medications    Prior to Admission medications   Not on File    Family History No family history on file.  Social History Social History  Substance Use Topics  . Smoking status: Former Smoker    Packs/day: 1.50    Types: Cigarettes  . Smokeless tobacco: Never Used  . Alcohol use Not on file     Comment: occasionally drinking     Allergies   Patient has no known allergies.   Review of Systems Review of Systems  Constitutional: Negative for fever.  Respiratory: Positive for shortness of breath.   Cardiovascular:  Positive for chest pain. Negative for leg swelling.  Gastrointestinal: Negative for vomiting.  All other systems reviewed and are negative.    Physical Exam Updated Vital Signs BP 119/86   Pulse 75   Temp 98.3 F (36.8 C)   Resp 18   Ht 1.626 m ( )   Wt 127 kg (280 lb)   SpO2 97%   BMI 48.06 kg/m   Physical Exam  CONSTITUTIONAL: sleeping on arrival to room, easily arousable, well developed/well nourished HEAD: Normocephalic/atraumatic EYES: EOMI/PERRL ENMT: Mucous membranes moist NECK: supple no meningeal signs SPINE/BACK:entire spine nontender CV: S1/S2 noted, no murmurs/rubs/gallops noted Chest - no tenderness or bruising LUNGS: Lungs are clear to auscultation bilaterally, no apparent distress ABDOMEN: soft, nontender  GU:no cva tenderness NEURO: Pt is awake/alert/appropriate, moves all extremitiesx4.  No facial droop.   EXTREMITIES: pulses normal/equal, full ROM, no calf tenderness or edema SKIN: warm, color normal PSYCH: no abnormalities of mood noted, alert and oriented to situation  ED Treatments / Results  Labs (all labs ordered are listed, but only abnormal results are displayed) Labs Reviewed  BASIC METABOLIC PANEL - Abnormal; Notable for the following:       Result Value   Sodium 134 (*)    Glucose, Bld 457 (*)    All other components within normal limits  CBC - Abnormal; Notable for the following:    HCT 38.6 (*)  All other components within normal limits  I-STAT TROPONIN, ED  CBG MONITORING, ED    EKG  EKG Interpretation  Date/Time:  Tuesday September 02 2017 23:49:00 EDT Ventricular Rate:  89 PR Interval:  148 QRS Duration: 100 QT Interval:  356 QTC Calculation: 433 R Axis:   18 Text Interpretation:  Normal sinus rhythm Normal ECG No significant change since last tracing Confirmed by Zadie Rhine ([id number]) on 09/03/2017 4:18:04 AM Also confirmed by Zadie Rhine ([id number]), editor Madalyn Rob [phone])  on 09/03/2017 7:26:13 AM         Radiology Dg Chest 2 View  Result Date: 09/03/2017 CLINICAL DATA:  Recurrent chest pain, former smoker EXAM: CHEST  2 VIEW COMPARISON:  None FINDINGS: Normal heart size, mediastinal contours, and pulmonary vascularity. Lungs clear. No pleural effusion or pneumothorax. Bones unremarkable. IMPRESSION: Normal exam. Electronically Signed   By: Ulyses Southward M.D.   On: 09/03/2017 00:28    Procedures Procedures (including critical care time)  Medications Ordered in ED Medications - No data to display   Initial Impression / Assessment and Plan / ED Course  I have reviewed the triage vital signs and the nursing notes.  Pertinent labs & imaging results that were available during my care of the patient were reviewed by me and considered in my medical decision making (see chart for details).     Pt stable EKG/CXR unremarkable I have low suspicion for ACS Doubt PE - he is PERC negative He reports it mostly feels like heartburn at times He is improved at this time He can f/u as outpatient for this  As for hyperglycemia, he admits to drinking mountain dew just prior to arrival, however he likely does have diabetes Discussed dietary changes Given outpatient PCP referral   Final Clinical Impressions(s) / ED Diagnoses   Final diagnoses:  Precordial pain  Hyperglycemia    New Prescriptions Discharge Medication List as of 09/03/2017  5:02 AM       Zadie Rhine, MD 09/03/17 [phone]

## 2017-12-13 ENCOUNTER — Emergency Department (HOSPITAL_COMMUNITY)
Admission: EM | Admit: 2017-12-13 | Discharge: 2017-12-13 | Disposition: A | Discharge status: Home / Self Care | Payer: Self-pay | Attending: Emergency Medicine | Admitting: Emergency Medicine

## 2017-12-13 ENCOUNTER — Encounter (HOSPITAL_COMMUNITY): Payer: Self-pay | Admitting: Emergency Medicine

## 2017-12-13 DIAGNOSIS — J111 Influenza due to unidentified influenza virus with other respiratory manifestations: Secondary | ICD-10-CM | POA: Insufficient documentation

## 2017-12-13 DIAGNOSIS — R69 Illness, unspecified: Secondary | ICD-10-CM

## 2017-12-13 DIAGNOSIS — Z87891 Personal history of nicotine dependence: Secondary | ICD-10-CM | POA: Insufficient documentation

## 2017-12-13 LAB — RAPID STREP SCREEN (MED CTR MEBANE ONLY): Streptococcus, Group A Screen (Direct): NEGATIVE

## 2017-12-13 MED ORDER — ACETAMINOPHEN 325 MG PO TABS
650.0000 mg | ORAL_TABLET | Freq: Once | ORAL | Status: AC
Start: 2017-12-13 — End: 2017-12-13
  Administered 2017-12-13: 650 mg via ORAL
  Filled 2017-12-13: qty 2

## 2017-12-13 MED ORDER — BENZONATATE 100 MG PO CAPS: 100.0000 mg | ORAL_CAPSULE | Freq: Three times a day (TID) | ORAL | 0 refills | Status: DC

## 2017-12-13 MED ORDER — OSELTAMIVIR PHOSPHATE 75 MG PO CAPS: 75.0000 mg | ORAL_CAPSULE | Freq: Two times a day (BID) | ORAL | 0 refills | Status: DC

## 2017-12-13 MED ORDER — IBUPROFEN 400 MG PO TABS: 600.0000 mg | ORAL_TABLET | Freq: Once | ORAL | Status: DC

## 2017-12-13 MED ORDER — LIDOCAINE VISCOUS 2 % MT SOLN: 15.0000 mL | OROMUCOSAL | 0 refills | Status: DC | PRN

## 2017-12-13 MED ORDER — FLUTICASONE PROPIONATE 50 MCG/ACT NA SUSP: 1.0000 | Freq: Every day | NASAL | 2 refills | Status: DC

## 2017-12-13 MED ORDER — DEXAMETHASONE SODIUM PHOSPHATE 10 MG/ML IJ SOLN
10.0000 mg | Freq: Once | INTRAMUSCULAR | Status: AC
  Administered 2017-12-13: 10 mg via INTRAMUSCULAR
  Filled 2017-12-13: qty 1

## 2017-12-13 NOTE — ED Notes (Signed)
Declined W/C at D/C and was escorted to lobby by RN. 

## 2017-12-13 NOTE — ED Triage Notes (Signed)
Pt reports new onset fever chills with runny nose/ congestion since yesterday. No shortness of breath. PT does endorse some nausea. Did not get influenza vaccine. Self reports fever of 105 at home. Temp 100.4 at triage. Has not taken any nsaids. HR 101.

## 2017-12-13 NOTE — Discharge Instructions (Signed)
Please read attached information regarding your condition. Began Tamiflu twice daily for your flulike symptoms. Swish and spit lidocaine solution to help with sore throat.  Do not swallow. Use Flonase to help with nasal congestion.  Take Tessalon Perles as needed for cough. Continue Tylenol or ibuprofen as needed for body aches and fever. Return to ED for worsening symptoms, trouble breathing, trouble swallowing, severe chest pain, increased vomiting.

## 2017-12-13 NOTE — ED Provider Notes (Signed)
[facility] Provider Note   CSN: [id number] Arrival date & time: 12/13/17  [id number]     History   Chief Complaint Chief Complaint  Patient presents with  . Fever  . Influenza    HPI Alexander Mccann is a 22 y.o. male with a past medical history of obesity, who presents to ED for evaluation of influenza-like illness since yesterday.  He reports nasal congestion, sore throat, cough, fever with T-max 100.5.  Also reports generalized body aches and nausea.  No sick contacts with similar symptoms.  He has not tried any over-the-counter medications to help with symptoms.  He did not receive his influenza vaccine this year.  He denies any trouble breathing, trouble swallowing, vomiting, abdominal pain or chest pain.  HPI  Past Medical History:  Diagnosis Date  . Obesity     Patient Active Problem List   Diagnosis Date Noted  . VIRAL INFECTION 01/19/2008  . ACUTE PHARYNGITIS 01/19/2008  . MORBID OBESITY 01/08/2008  . INGROWN TOENAIL 01/08/2008  . TINEA VERSICOLOR 08/04/2007  . HYPOGONADISM 08/04/2007  . BRONCHITIS NOS 08/04/2007    History reviewed. No pertinent surgical history.     Home Medications    Prior to Admission medications   Medication Sig Start Date End Date Taking? Authorizing Provider  benzonatate (TESSALON) 100 MG capsule Take 1 capsule (100 mg total) by mouth every 8 (eight) hours. 12/13/17   Conita Amenta, PA-C  fluticasone (FLONASE) 50 MCG/ACT nasal spray Place 1 spray into both nostrils daily. 12/13/17   Ignatius Kloos, PA-C  lidocaine (XYLOCAINE) 2 % solution Use as directed 15 mLs in the mouth or throat as needed for mouth pain. 12/13/17   Malyn Aytes, PA-C  oseltamivir (TAMIFLU) 75 MG capsule Take 1 capsule (75 mg total) by mouth every 12 (twelve) hours. 12/13/17   Dietrich PatesKhatri, [employer] Zynda, PA-C    Family History History reviewed. No pertinent family history.  Social History Social History   Tobacco Use  . Smoking status: Former  Smoker    Packs/day: 1.50    Types: Cigarettes  . Smokeless tobacco: Never Used  Substance Use Topics  . Alcohol use: Not on file    Comment: occasionally drinking  . Drug use: No     Allergies   Patient has no known allergies.   Review of Systems Review of Systems  Constitutional: Positive for fever. Negative for activity change, appetite change and chills.  HENT: Positive for congestion, rhinorrhea, sneezing and sore throat. Negative for drooling, ear discharge, ear pain, facial swelling, sinus pressure, sinus pain, trouble swallowing and voice change.   Respiratory: Positive for cough. Negative for shortness of breath.   Cardiovascular: Negative for chest pain.  Gastrointestinal: Positive for nausea. Negative for vomiting.  Skin: Negative for rash.     Physical Exam Updated Vital Signs BP (!) 139/93 (BP Location: Right Arm)   Pulse (!) 101   Temp (!) 100.4 F (38 C) (Oral)   Resp 20   SpO2 98%   Physical Exam  Constitutional: He appears well-developed and well-nourished. No distress.  HENT:  Head: Normocephalic and atraumatic.  Right Ear: Tympanic membrane normal.  Left Ear: Tympanic membrane normal.  Nose: Rhinorrhea present.  Mouth/Throat: Uvula is midline. No trismus in the jaw. No uvula swelling. Posterior oropharyngeal erythema present. No posterior oropharyngeal edema. Tonsils are 1+ on the right. Tonsils are 1+ on the left.  Bilateral, symmetrical tonsillar enlargement. Patient does not appear to be in acute distress. No trismus or  drooling present. No pooling of secretions. Patient is tolerating secretions and is not in respiratory distress. No neck pain or tenderness to palpation of the neck. Full active and passive range of motion of the neck. No evidence of RPA or PTA.  Eyes: Conjunctivae and EOM are normal. No scleral icterus.  Neck: Normal range of motion.  Pulmonary/Chest: Effort normal. No respiratory distress.  Neurological: He is alert.  Skin: No  rash noted. He is not diaphoretic.  Psychiatric: He has a normal mood and affect.  Nursing note and vitals reviewed.    ED Treatments / Results  Labs (all labs ordered are listed, but only abnormal results are displayed) Labs Reviewed  RAPID STREP SCREEN (NOT AT [facility])  CULTURE, GROUP A STREP [facility])    EKG  EKG Interpretation None       Radiology No results found.  Procedures Procedures (including critical care time)  Medications Ordered in ED Medications  dexamethasone (DECADRON) injection 10 mg (10 mg Intramuscular Given 12/13/17 1037)  acetaminophen (TYLENOL) tablet 650 mg (650 mg Oral Given 12/13/17 1038)     Initial Impression / Assessment and Plan / ED Course  I have reviewed the triage vital signs and the nursing notes.  Pertinent labs & imaging results that were available during my care of the patient were reviewed by me and considered in my medical decision making (see chart for details).     Patient presents to ED for evaluation of influenza-like symptoms since yesterday.  He reports nasal congestion, cough, fever as well as some nausea.  On physical exam he is overall well-appearing.  Initial tachycardia to 101 has improved with supportive measures.  There is bilateral tonsillar enlargement on physical exam but the uvula is midline.  Low suspicion for RPA or PTA.  Strep test returned as negative.  No abdominal or chest tenderness to palpation noted.  I suspect influenza-like illness or possible influenza.  Because patient is still in the window for treatment, will offer Tamiflu but advised risks and benefits.  Also will give supportive treatment with Tessalon Perles, lidocaine solution to swish and spit and Flonase for nasal congestion.  Advised to continue Tylenol or ibuprofen as needed for body aches and fever.  Patient appears stable for discharge at this time.  Strict return precautions given.  Final Clinical Impressions(s) / ED Diagnoses   Final diagnoses:    Influenza-like illness    ED Discharge Orders        Ordered    oseltamivir (TAMIFLU) 75 MG capsule  Every 12 hours     12/13/17 1106    lidocaine (XYLOCAINE) 2 % solution  As needed     12/13/17 1106    benzonatate (TESSALON) 100 MG capsule  Every 8 hours     12/13/17 1106    fluticasone (FLONASE) 50 MCG/ACT nasal spray  Daily     12/13/17 1106     Portions of this note were generated with [employer]. Dictation errors may occur despite best attempts at proofreading.    Dietrich Pates, PA-C 12/13/17 1109    Loren Racer, MD 12/14/17 [phone]

## 2017-12-14 LAB — CULTURE, GROUP A STREP (THRC)

## 2018-03-20 ENCOUNTER — Encounter (HOSPITAL_COMMUNITY): Payer: Self-pay | Admitting: *Deleted

## 2018-03-20 ENCOUNTER — Other Ambulatory Visit: Payer: Self-pay

## 2018-03-20 ENCOUNTER — Emergency Department (HOSPITAL_COMMUNITY)
Admission: EM | Admit: 2018-03-20 | Discharge: 2018-03-20 | Disposition: A | Discharge status: Home / Self Care | Payer: Medicaid Other | Attending: Emergency Medicine | Admitting: Emergency Medicine

## 2018-03-20 ENCOUNTER — Emergency Department (HOSPITAL_COMMUNITY): Payer: Medicaid Other

## 2018-03-20 DIAGNOSIS — R079 Chest pain, unspecified: Secondary | ICD-10-CM | POA: Insufficient documentation

## 2018-03-20 DIAGNOSIS — M545 Low back pain: Secondary | ICD-10-CM | POA: Insufficient documentation

## 2018-03-20 DIAGNOSIS — Z5321 Procedure and treatment not carried out due to patient leaving prior to being seen by health care provider: Secondary | ICD-10-CM | POA: Insufficient documentation

## 2018-03-20 LAB — BASIC METABOLIC PANEL
Anion gap: 8 (ref 5–15)
BUN: 5 mg/dL — AB (ref 6–20)
CHLORIDE: 103 mmol/L (ref 101–111)
CO2: 26 mmol/L (ref 22–32)
Calcium: 9.5 mg/dL (ref 8.9–10.3)
Creatinine, Ser: 0.67 mg/dL (ref 0.61–1.24)
GFR calc Af Amer: >60 mL/min (ref >60)
GFR calc non Af Amer: >60 mL/min (ref >60)
GLUCOSE: 277 mg/dL — AB (ref 65–99)
POTASSIUM: 4.2 mmol/L (ref 3.5–5.1)
Sodium: 137 mmol/L (ref 135–145)

## 2018-03-20 LAB — CBC
HEMATOCRIT: 42.1 % (ref 39.0–52.0)
Hemoglobin: 14.6 g/dL (ref 13.0–17.0)
MCH: 28.4 pg (ref 26.0–34.0)
MCHC: 34.7 g/dL (ref 30.0–36.0)
MCV: 81.9 fL (ref 78.0–100.0)
Platelets: 206 10*3/uL (ref 150–400)
RBC: 5.14 MIL/uL (ref 4.22–5.81)
RDW: 13.4 % (ref 11.5–15.5)
WBC: 7.2 10*3/uL (ref 4.0–10.5)

## 2018-03-20 LAB — I-STAT TROPONIN, ED: Troponin i, poc: 0 ng/mL (ref 0.00–0.08)

## 2018-03-20 NOTE — ED Notes (Signed)
Unable to locate pt. at waiting area several times by staff.  

## 2018-03-20 NOTE — ED Triage Notes (Signed)
Pt in c/o mid radiating CP to mid and lower back onset this am, denies SOB, denies n/v/d, pt A&o x4

## 2018-09-11 ENCOUNTER — Emergency Department (HOSPITAL_COMMUNITY)
Admission: EM | Admit: 2018-09-11 | Discharge: 2018-09-11 | Discharge status: Against Medical Advice | Payer: Medicaid Other

## 2018-09-11 ENCOUNTER — Other Ambulatory Visit: Payer: Self-pay

## 2018-09-11 NOTE — ED Notes (Signed)
While tech was placing electrodes for ECG pt. Asked "I don't need this". Tech explained procedures to pt. Pt. York Spaniel "my girlfriend has work at for I want to leave". Pt. Got up and stormed out. Tech pulling OTF.

## 2018-10-13 ENCOUNTER — Encounter (HOSPITAL_COMMUNITY): Payer: Self-pay | Admitting: Emergency Medicine

## 2018-10-13 ENCOUNTER — Emergency Department (HOSPITAL_COMMUNITY)
Admission: EM | Admit: 2018-10-13 | Discharge: 2018-10-13 | Disposition: A | Discharge status: Home / Self Care | Payer: No Typology Code available for payment source | Attending: Emergency Medicine | Admitting: Emergency Medicine

## 2018-10-13 ENCOUNTER — Emergency Department (HOSPITAL_COMMUNITY): Payer: No Typology Code available for payment source

## 2018-10-13 DIAGNOSIS — M25552 Pain in left hip: Secondary | ICD-10-CM | POA: Insufficient documentation

## 2018-10-13 DIAGNOSIS — Z79899 Other long term (current) drug therapy: Secondary | ICD-10-CM | POA: Insufficient documentation

## 2018-10-13 DIAGNOSIS — M542 Cervicalgia: Secondary | ICD-10-CM | POA: Diagnosis not present

## 2018-10-13 DIAGNOSIS — R51 Headache: Secondary | ICD-10-CM | POA: Insufficient documentation

## 2018-10-13 DIAGNOSIS — Z87891 Personal history of nicotine dependence: Secondary | ICD-10-CM | POA: Insufficient documentation

## 2018-10-13 DIAGNOSIS — M25512 Pain in left shoulder: Secondary | ICD-10-CM | POA: Diagnosis not present

## 2018-10-13 MED ORDER — METHOCARBAMOL 500 MG PO TABS: 500.0000 mg | ORAL_TABLET | Freq: Two times a day (BID) | ORAL | 0 refills | Status: AC
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
## 2018-10-13 NOTE — ED Notes (Signed)
Pt. Return from CT via wheelchair.

## 2018-10-13 NOTE — ED Notes (Signed)
Pt. To CT via stretcher. 
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
## 2018-10-13 NOTE — Discharge Instructions (Signed)
I have prescribed muscle relaxers for your pain, please do not drink or drive while taking this medications as they can make you drowsy.  Please follow-up with PCP in 1 week for reevaluation of your symptoms.  You experience any bowel or bladder incontinence, fever, worsening in your symptoms please return to the ED. ° °

## 2019-02-01 ENCOUNTER — Emergency Department (HOSPITAL_COMMUNITY)
Admission: EM | Admit: 2019-02-01 | Discharge: 2019-02-02 | Disposition: A | Discharge status: Home / Self Care | Payer: Self-pay | Attending: Emergency Medicine | Admitting: Emergency Medicine

## 2019-02-01 ENCOUNTER — Other Ambulatory Visit: Payer: Self-pay

## 2019-02-01 ENCOUNTER — Encounter (HOSPITAL_COMMUNITY): Payer: Self-pay | Admitting: Emergency Medicine

## 2019-02-01 DIAGNOSIS — Z5321 Procedure and treatment not carried out due to patient leaving prior to being seen by health care provider: Secondary | ICD-10-CM | POA: Insufficient documentation

## 2019-02-01 DIAGNOSIS — N50819 Testicular pain, unspecified: Secondary | ICD-10-CM | POA: Insufficient documentation
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
## 2019-02-02 ENCOUNTER — Emergency Department (HOSPITAL_COMMUNITY): Payer: Self-pay

## 2019-02-02 NOTE — ED Notes (Signed)
Pt left without being seen.

## 2019-02-02 NOTE — ED Notes (Signed)
Called by registration earlier. Called again x5 no reply. Not seen in lobby at this time.

## 2019-04-02 ENCOUNTER — Encounter (HOSPITAL_COMMUNITY): Payer: Self-pay | Admitting: Emergency Medicine

## 2019-04-02 ENCOUNTER — Emergency Department (HOSPITAL_COMMUNITY)
Admission: EM | Admit: 2019-04-02 | Discharge: 2019-04-02 | Disposition: A | Discharge status: Home / Self Care | Payer: Self-pay | Attending: Emergency Medicine | Admitting: Emergency Medicine

## 2019-04-02 ENCOUNTER — Other Ambulatory Visit: Payer: Self-pay

## 2019-04-02 DIAGNOSIS — F17210 Nicotine dependence, cigarettes, uncomplicated: Secondary | ICD-10-CM | POA: Insufficient documentation

## 2019-04-02 DIAGNOSIS — Z711 Person with feared health complaint in whom no diagnosis is made: Secondary | ICD-10-CM

## 2019-04-02 DIAGNOSIS — Z202 Contact with and (suspected) exposure to infections with a predominantly sexual mode of transmission: Secondary | ICD-10-CM | POA: Insufficient documentation

## 2019-04-02 NOTE — ED Triage Notes (Signed)
Onset middle of the night patient had unprotected sex with a friend who was diagnosed with an STD a week ago who received an antibiotic injection. Patient concerned if he now should be treated. Does not have a symptoms at this time.

## 2019-04-02 NOTE — ED Provider Notes (Signed)
[facility] Provider Note   CSN: [id number] Arrival date & time: 04/02/19  1015    History   Chief Complaint Chief Complaint  Patient presents with  . Exposure to STD    HPI Alexander Mccann is a [age] y.o. male who presents to ED for concern for STD. He states he went over to a male friend's house last night "I wanted to talk to her because she told me she was depressed. But we got drunk and then we had unprotected sex with oral." He was then informed that she was tested positive and treated for gonorrhea with an antibiotic shot 1 week ago. Patient himself is not experiencing any symptoms including dysuria, hematuria, penile discharge, testicular pain or swelling, rashes or lesions.     HPI  Past Medical History:  Diagnosis Date  . Obesity     Patient Active Problem List   Diagnosis Date Noted  . VIRAL INFECTION 01/19/2008  . ACUTE PHARYNGITIS 01/19/2008  . MORBID OBESITY 01/08/2008  . INGROWN TOENAIL 01/08/2008  . TINEA VERSICOLOR 08/04/2007  . HYPOGONADISM 08/04/2007  . BRONCHITIS NOS 08/04/2007    History reviewed. No pertinent surgical history.      Home Medications    Prior to Admission medications   Medication Sig Start Date End Date Taking? Authorizing Provider  benzonatate (TESSALON) 100 MG capsule Take 1 capsule (100 mg total) by mouth every 8 (eight) hours. Patient not taking: Reported on 03/20/2018 12/13/17   Dietrich PatesKhatri, Linton Stolp, PA-C  fluticasone (FLONASE) 50 MCG/ACT nasal spray Place 1 spray into both nostrils daily. Patient not taking: Reported on 03/20/2018 12/13/17   Dietrich PatesKhatri, Carry Weesner, PA-C  lidocaine (XYLOCAINE) 2 % solution Use as directed 15 mLs in the mouth or throat as needed for mouth pain. Patient not taking: Reported on 03/20/2018 12/13/17   Dietrich PatesKhatri, Sahvanna Mcmanigal, PA-C  oseltamivir (TAMIFLU) 75 MG capsule Take 1 capsule (75 mg total) by mouth every 12 (twelve) hours. Patient not taking: Reported on 03/20/2018 12/13/17   Dietrich PatesKhatri,  Trew Sunde, PA-C    Family History No family history on file.  Social History Social History   Tobacco Use  . Smoking status: Current Every Day Smoker    Packs/day: 1.50    Types: Cigarettes  . Smokeless tobacco: Never Used  Substance Use Topics  . Alcohol use: Yes    Comment: occasionally drinking  . Drug use: No     Allergies   Patient has no known allergies.   Review of Systems Review of Systems  Constitutional: Negative for chills and fever.  HENT: Negative for sore throat.   Genitourinary: Negative for dysuria, flank pain, penile pain, penile swelling, scrotal swelling and testicular pain.     Physical Exam Updated Vital Signs BP (!) 142/89 (BP Location: Right Arm)   Pulse 82   Temp 97.8 F (36.6 C) (Oral)   Resp 16   Ht 5\' 8"  (1.727 m)   Wt 115.7 kg   SpO2 97%   BMI 38.77 kg/m   Physical Exam Vitals signs and nursing note reviewed.  Constitutional:      General: He is not in acute distress.    Appearance: He is well-developed. He is not diaphoretic.  HENT:     Head: Normocephalic and atraumatic.  Eyes:     General: No scleral icterus.    Conjunctiva/sclera: Conjunctivae normal.  Neck:     Musculoskeletal: Normal range of motion.  Pulmonary:     Effort: Pulmonary effort is normal. No respiratory  distress.  Genitourinary:    Comments: Patient declined. Skin:    Findings: No rash.  Neurological:     Mental Status: He is alert.      ED Treatments / Results  Labs (all labs ordered are listed, but only abnormal results are displayed) Labs Reviewed  GC/CHLAMYDIA PROBE AMP ([place]) NOT AT [facility]    EKG None  Radiology No results found.  Procedures Procedures (including critical care time)  Medications Ordered in ED Medications - No data to display   Initial Impression / Assessment and Plan / ED Course  I have reviewed the triage vital signs and the nursing notes.  Pertinent labs & imaging results that were available during my  care of the patient were reviewed by me and considered in my medical decision making (see chart for details).        23yo M presents to ED for concern for STD. He had unprotected sexual intercourse with a male friend last night. She was treated for gonorrhea appropriately 1 week ago. Patient himself denies any symptoms. He declined GU exam. Will obtain urine and treat appropriately when resulted. Patient agreeable to plan.  Patient is hemodynamically stable, in NAD, and able to ambulate in the ED. Evaluation does not show pathology that would require ongoing emergent intervention or inpatient treatment. I explained the diagnosis to the patient. Pain has been managed and has no complaints prior to discharge. Patient is comfortable with above plan and is stable for discharge at this time. All questions were answered prior to disposition. Strict return precautions for returning to the ED were discussed. Encouraged follow up with PCP.   An After Visit Summary was printed and given to the patient.   Portions of this note were generated with Scientist, clinical (histocompatibility and immunogenetics). Dictation errors may occur despite best attempts at proofreading.   Final Clinical Impressions(s) / ED Diagnoses   Final diagnoses:  Concern about STD in male without diagnosis    ED Discharge Orders    None       Dietrich Pates, PA-C 04/02/19 1056    Rolla, DO 04/02/19 1607

## 2019-04-02 NOTE — Discharge Instructions (Signed)
We will contact you with the results of your lab work when it is available. Return to the ED if you start to develop pain in your testicles, penile discharge, swelling or rashes.

## 2019-04-05 LAB — GC/CHLAMYDIA PROBE AMP (~~LOC~~) NOT AT ARMC
Chlamydia: NEGATIVE
Neisseria Gonorrhea: NEGATIVE

## 2019-04-08 ENCOUNTER — Other Ambulatory Visit: Payer: Self-pay

## 2019-04-08 ENCOUNTER — Emergency Department (HOSPITAL_COMMUNITY): Payer: Self-pay

## 2019-04-08 ENCOUNTER — Emergency Department (HOSPITAL_COMMUNITY)
Admission: EM | Admit: 2019-04-08 | Discharge: 2019-04-08 | Disposition: A | Discharge status: Home / Self Care | Payer: Self-pay | Attending: Emergency Medicine | Admitting: Emergency Medicine

## 2019-04-08 ENCOUNTER — Encounter (HOSPITAL_COMMUNITY): Payer: Self-pay | Admitting: Emergency Medicine

## 2019-04-08 DIAGNOSIS — Y939 Activity, unspecified: Secondary | ICD-10-CM | POA: Insufficient documentation

## 2019-04-08 DIAGNOSIS — Y929 Unspecified place or not applicable: Secondary | ICD-10-CM | POA: Insufficient documentation

## 2019-04-08 DIAGNOSIS — Y999 Unspecified external cause status: Secondary | ICD-10-CM | POA: Insufficient documentation

## 2019-04-08 DIAGNOSIS — S6991XA Unspecified injury of right wrist, hand and finger(s), initial encounter: Secondary | ICD-10-CM | POA: Insufficient documentation

## 2019-04-08 DIAGNOSIS — F17210 Nicotine dependence, cigarettes, uncomplicated: Secondary | ICD-10-CM | POA: Insufficient documentation

## 2019-04-08 MED ORDER — IBUPROFEN 800 MG PO TABS: 800.0000 mg | ORAL_TABLET | Freq: Three times a day (TID) | ORAL | 0 refills | Status: DC

## 2019-04-08 MED ORDER — IBUPROFEN 400 MG PO TABS
600.0000 mg | ORAL_TABLET | Freq: Once | ORAL | Status: AC
  Administered 2019-04-08: 600 mg via ORAL
  Filled 2019-04-08: qty 1

## 2019-04-08 MED ORDER — ACETAMINOPHEN 500 MG PO TABS: 1000.0000 mg | ORAL_TABLET | Freq: Once | ORAL | Status: DC
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
## 2019-06-15 ENCOUNTER — Emergency Department (HOSPITAL_COMMUNITY): Payer: Self-pay

## 2019-06-15 ENCOUNTER — Encounter (HOSPITAL_COMMUNITY): Payer: Self-pay | Admitting: Emergency Medicine

## 2019-06-15 ENCOUNTER — Emergency Department (HOSPITAL_COMMUNITY)
Admission: EM | Admit: 2019-06-15 | Discharge: 2019-06-15 | Disposition: A | Discharge status: Home / Self Care | Payer: Self-pay | Attending: Emergency Medicine | Admitting: Emergency Medicine

## 2019-06-15 ENCOUNTER — Other Ambulatory Visit: Payer: Self-pay

## 2019-06-15 DIAGNOSIS — R059 Cough, unspecified: Secondary | ICD-10-CM

## 2019-06-15 DIAGNOSIS — F17210 Nicotine dependence, cigarettes, uncomplicated: Secondary | ICD-10-CM | POA: Insufficient documentation

## 2019-06-15 DIAGNOSIS — R05 Cough: Secondary | ICD-10-CM | POA: Insufficient documentation

## 2019-06-15 MED ORDER — LORATADINE 10 MG PO TABS: 10.0000 mg | ORAL_TABLET | Freq: Every day | ORAL | 0 refills | Status: DC

## 2019-06-15 MED ORDER — BENZONATATE 100 MG PO CAPS: 100.0000 mg | ORAL_CAPSULE | Freq: Three times a day (TID) | ORAL | 0 refills | Status: DC

## 2019-06-15 NOTE — ED Triage Notes (Signed)
Reports 14 days of throat pain with coughing, reports he thinks it may be from smoking.  Reports the cough is worse today. Reports some green mucous with the cough.  Reports this happens twice a year.

## 2019-06-15 NOTE — ED Notes (Signed)
Discharge instructions and prescriptions discussed with Pt. Pt verbalized understanding. Pt stable and ambulatory.   

## 2019-06-15 NOTE — Discharge Instructions (Signed)
Please read attached information. If you experience any new or worsening signs or symptoms please return to the emergency room for evaluation. Please follow-up with your primary care provider or specialist as discussed. Please use medication prescribed only as directed and discontinue taking if you have any concerning signs or symptoms.   °

## 2019-06-15 NOTE — ED Provider Notes (Signed)
[facility]ERGENC[facility]PART[facility] Provider Note   CSN: [id number] Arrival date & time: 06/15/19  1153    History   Chief Complaint Chief Complaint  Patient presents with  . Cough    HPI Alexander Mccann is a 23 y.o. male.     HPI    23 year old male presents today with complaints of cough.  Patient notes over the last month he has had cough that produces mucus.  He denies any significant chest pain or shortness of breath, denies any fever or any other upper respiratory symptoms.  Patient notes that he usually has this proximately twice a year.  He notes usually had the beginning of the summer.  He notes this seems similar.  He has not used any medications for this.  He denies any close sick contacts.  He does note that he vapes, he has no chronic health conditions.    Past Medical History:  Diagnosis Date  . Obesity     Patient Active Problem List   Diagnosis Date Noted  . VIRAL INFECTION 01/19/2008  . ACUTE PHARYNGITIS 01/19/2008  . MORBID OBESITY 01/08/2008  . INGROWN TOENAIL 01/08/2008  . TINEA VERSICOLOR 08/04/2007  . HYPOGONADISM 08/04/2007  . BRONCHITIS NOS 08/04/2007    History reviewed. No pertinent surgical history.      Home Medications    Prior to Admission medications   Medication Sig Start Date End Date Taking? Authorizing Provider  benzonatate (TESSALON) 100 MG capsule Take 1 capsule (100 mg total) by mouth every 8 (eight) hours. 06/15/19   Dhrithi Riche, Tinnie GensJeffrey, PA-C  fluticasone (FLONASE) 50 MCG/ACT nasal spray Place 1 spray into both nostrils daily. Patient not taking: Reported on 03/20/2018 12/13/17   Dietrich PatesKhatri, Hina, PA-C  ibuprofen (ADVIL) 800 MG tablet Take 1 tablet (800 mg total) by mouth 3 (three) times daily. 04/08/19   Palumbo, April, MD  lidocaine (XYLOCAINE) 2 % solution Use as directed 15 mLs in the mouth or throat as needed for mouth pain. Patient not taking: Reported on 03/20/2018 12/13/17   Dietrich PatesKhatri, Hina, PA-C  loratadine  (CLARITIN) 10 MG tablet Take 1 tablet (10 mg total) by mouth daily. 06/15/19   Zelma Mazariego, Tinnie GensJeffrey, PA-C  oseltamivir (TAMIFLU) 75 MG capsule Take 1 capsule (75 mg total) by mouth every 12 (twelve) hours. Patient not taking: Reported on 03/20/2018 12/13/17   Dietrich PatesKhatri, Hina, PA-C    Family History History reviewed. No pertinent family history.  Social History Social History   Tobacco Use  . Smoking status: Current Every Day Smoker    Packs/day: 1.00    Types: Cigarettes  . Smokeless tobacco: Never Used  Substance Use Topics  . Alcohol use: Yes    Comment: occasionally drinking  . Drug use: No     Allergies   Patient has no known allergies.   Review of Systems Review of Systems  All other systems reviewed and are negative.    Physical Exam Updated Vital Signs BP 136/85   Pulse 74   Temp 97.7 F (36.5 C) (Oral)   Resp 18   SpO2 97%   Physical Exam Vitals signs and nursing note reviewed.  Constitutional:      Appearance: He is well-developed.  HENT:     Head: Normocephalic and atraumatic.  Eyes:     General: No scleral icterus.       Right eye: No discharge.        Left eye: No discharge.     Conjunctiva/sclera: Conjunctivae normal.  Pupils: Pupils are equal, round, and reactive to light.  Neck:     Musculoskeletal: Normal range of motion.     Vascular: No JVD.     Trachea: No tracheal deviation.  Pulmonary:     Effort: Pulmonary effort is normal. No respiratory distress.     Breath sounds: Normal breath sounds. No stridor. No wheezing, rhonchi or rales.  Neurological:     Mental Status: He is alert and oriented to person, place, and time.     Coordination: Coordination normal.  Psychiatric:        Behavior: Behavior normal.        Thought Content: Thought content normal.        Judgment: Judgment normal.      ED Treatments / Results  Labs (all labs ordered are listed, but only abnormal results are displayed) Labs Reviewed  NOVEL CORONAVIRUS, NAA  (HOSPITAL ORDER, SEND-OUT TO REF LAB)    EKG None  Radiology Dg Chest Portable 1 View  Result Date: 06/15/2019 CLINICAL DATA:  Throat pain and coughing 2 weeks.  Smoker. EXAM: PORTABLE CHEST 1 VIEW COMPARISON:  03/20/2018 FINDINGS: Lungs are somewhat hypoinflated and otherwise clear. Cardiomediastinal silhouette and remainder of the exam is unchanged. IMPRESSION: No active disease. Electronically Signed   By: Marin Olp M.D.   On: 06/15/2019 13:33    Procedures Procedures (including critical care time)  Medications Ordered in ED Medications - No data to display   Initial Impression / Assessment and Plan / ED Course  I have reviewed the triage vital signs and the nursing notes.  Pertinent labs & imaging results that were available during my care of the patient were reviewed by me and considered in my medical decision making (see chart for details).          Assessment/Plan: Patient here with a cough.  Originally wanted code testing now he does not want to have testing.  He has no signs of severe respiratory disease or bacterial etiology.  Patient discharged symptomatic care strict return cautions.  He verbalized understanding and agreement to today's plan.      Final Clinical Impressions(s) / ED Diagnoses   Final diagnoses:  Cough    ED Discharge Orders         Ordered    benzonatate (TESSALON) 100 MG capsule  Every 8 hours     06/15/19 1337    loratadine (CLARITIN) 10 MG tablet  Daily     06/15/19 1338           Francee Gentile 06/17/19 1257    Julianne Rice, MD 06/18/19 2208

## 2019-06-15 NOTE — ED Notes (Addendum)
This RN clicked off covid swab order for requisition to print, when I went in the room pt refused it. PA [facility] aware.

## 2019-08-09 ENCOUNTER — Emergency Department (HOSPITAL_COMMUNITY)
Admission: EM | Admit: 2019-08-09 | Discharge: 2019-08-10 | Disposition: A | Discharge status: Home / Self Care | Payer: Self-pay | Attending: Emergency Medicine | Admitting: Emergency Medicine

## 2019-08-09 ENCOUNTER — Encounter (HOSPITAL_COMMUNITY): Payer: Self-pay | Admitting: Pharmacy Technician

## 2019-08-09 ENCOUNTER — Emergency Department (HOSPITAL_COMMUNITY): Payer: Self-pay

## 2019-08-09 ENCOUNTER — Other Ambulatory Visit: Payer: Self-pay

## 2019-08-09 DIAGNOSIS — Z5321 Procedure and treatment not carried out due to patient leaving prior to being seen by health care provider: Secondary | ICD-10-CM | POA: Insufficient documentation

## 2019-08-09 NOTE — ED Triage Notes (Signed)
Pt reports getting bad news PTA and then developed R shoulder pain. Pt denies CP, SOB.

## 2019-08-10 NOTE — ED Notes (Signed)
Pt called for vitals recheck x1 with no answer. Pt not visible in waiting room

## 2019-12-05 ENCOUNTER — Other Ambulatory Visit: Payer: Self-pay

## 2019-12-05 ENCOUNTER — Encounter (HOSPITAL_COMMUNITY): Payer: Self-pay | Admitting: Emergency Medicine

## 2019-12-05 ENCOUNTER — Emergency Department (HOSPITAL_COMMUNITY)
Admission: EM | Admit: 2019-12-05 | Discharge: 2019-12-05 | Disposition: A | Discharge status: Home / Self Care | Payer: Self-pay | Attending: Emergency Medicine | Admitting: Emergency Medicine

## 2019-12-05 ENCOUNTER — Emergency Department (HOSPITAL_COMMUNITY): Payer: Self-pay

## 2019-12-05 DIAGNOSIS — L0291 Cutaneous abscess, unspecified: Secondary | ICD-10-CM

## 2019-12-05 DIAGNOSIS — L0502 Pilonidal sinus with abscess: Secondary | ICD-10-CM | POA: Insufficient documentation

## 2019-12-05 DIAGNOSIS — L0390 Cellulitis, unspecified: Secondary | ICD-10-CM

## 2019-12-05 DIAGNOSIS — F17210 Nicotine dependence, cigarettes, uncomplicated: Secondary | ICD-10-CM | POA: Insufficient documentation

## 2019-12-05 DIAGNOSIS — L03317 Cellulitis of buttock: Secondary | ICD-10-CM | POA: Insufficient documentation

## 2019-12-05 LAB — I-STAT CHEM 8, ED
BUN: 9 mg/dL (ref 6–20)
Calcium, Ion: 1.14 mmol/L — ABNORMAL LOW (ref 1.15–1.40)
Chloride: 99 mmol/L (ref 98–111)
Creatinine, Ser: 0.6 mg/dL — ABNORMAL LOW (ref 0.61–1.24)
Glucose, Bld: 163 mg/dL — ABNORMAL HIGH (ref 70–99)
HCT: 35 % — ABNORMAL LOW (ref 39.0–52.0)
Hemoglobin: 11.9 g/dL — ABNORMAL LOW (ref 13.0–17.0)
Potassium: 3.4 mmol/L — ABNORMAL LOW (ref 3.5–5.1)
Sodium: 135 mmol/L (ref 135–145)
TCO2: 24 mmol/L (ref 22–32)

## 2019-12-05 MED ORDER — IOHEXOL 300 MG/ML  SOLN
100.0000 mL | Freq: Once | INTRAMUSCULAR | Status: AC | PRN
  Administered 2019-12-05: 06:00:00 100 mL via INTRAVENOUS

## 2019-12-05 MED ORDER — SULFAMETHOXAZOLE-TRIMETHOPRIM 800-160 MG PO TABS: 1.0000 | ORAL_TABLET | Freq: Two times a day (BID) | ORAL | 0 refills | Status: AC

## 2019-12-05 MED ORDER — FENTANYL CITRATE (PF) 100 MCG/2ML IJ SOLN
50.0000 ug | Freq: Once | INTRAMUSCULAR | Status: AC
  Administered 2019-12-05: 04:00:00 50 ug via INTRAVENOUS
  Filled 2019-12-05: qty 2

## 2019-12-05 MED ORDER — LIDOCAINE-EPINEPHRINE (PF) 2 %-1:200000 IJ SOLN
10.0000 mL | Freq: Once | INTRAMUSCULAR | Status: DC
  Filled 2019-12-05: qty 20

## 2019-12-05 MED ORDER — SULFAMETHOXAZOLE-TRIMETHOPRIM 800-160 MG PO TABS
1.0000 | ORAL_TABLET | Freq: Once | ORAL | Status: AC
  Administered 2019-12-05: 04:00:00 1 via ORAL
  Filled 2019-12-05: qty 1

## 2019-12-05 MED ORDER — OXYCODONE-ACETAMINOPHEN 5-325 MG PO TABS: 2.0000 | ORAL_TABLET | ORAL | 0 refills | Status: DC | PRN

## 2019-12-05 MED ORDER — SODIUM CHLORIDE 0.9 % IV BOLUS
1000.0000 mL | Freq: Once | INTRAVENOUS | Status: AC
  Administered 2019-12-05: 1000 mL via INTRAVENOUS

## 2019-12-05 NOTE — ED Triage Notes (Signed)
Patient reports worsening infected skin abscess with swelling and drainage at sacral area onset last week . Denies fever or chills .

## 2019-12-05 NOTE — ED Provider Notes (Signed)
Emergency Department Provider Note   I have reviewed the triage vital signs and the nursing notes.   HISTORY  Chief Complaint Abscess   HPI Alexander Mccann is a 24 y.o. male without significant past medical history who presents to the emergency department today secondary to 2 symptoms.  Patient states he has back pain.  Has had back pain in the past and this feels similar.  He states he seen a chiropractor not on any needed x-rays and is requesting that he gets that here.  He has no neurologic changes.  Is not severe.  He has not tried anything else for the symptoms.  Is more of just a soreness and stiffness when he wakes up in the morning. His second complaint is that he has abscesses near his gluteal cleft.  He states that these have been there for a few days and one started to drain today was in the shower.  He has not tried any for the symptoms either.  Patient has had them before.   No other associated or modifying symptoms.    Past Medical History:  Diagnosis Date  . Obesity     Patient Active Problem List   Diagnosis Date Noted  . VIRAL INFECTION 01/19/2008  . ACUTE PHARYNGITIS 01/19/2008  . MORBID OBESITY 01/08/2008  . INGROWN TOENAIL 01/08/2008  . TINEA VERSICOLOR 08/04/2007  . HYPOGONADISM 08/04/2007  . BRONCHITIS NOS 08/04/2007    History reviewed. No pertinent surgical history.  Current Outpatient Rx  . Order #: [id number] Class: Print  . Order #: [id number] Class: Print    Allergies Patient has no known allergies.  No family history on file.  Social History Social History   Tobacco Use  . Smoking status: Current Every Day Smoker    Packs/day: 1.00    Types: Cigarettes  . Smokeless tobacco: Never Used  Substance Use Topics  . Alcohol use: Yes    Comment: occasionally drinking  . Drug use: No    Review of Systems  All other systems negative except as documented in the HPI. All pertinent positives and negatives as reviewed in the  HPI. ____________________________________________   PHYSICAL EXAM:  VITAL SIGNS: ED Triage Vitals  Enc Vitals Group     BP 12/05/19 0024 (!) 157/99     Pulse Rate 12/05/19 0024 (!) 101     Resp 12/05/19 0024 20     Temp 12/05/19 0024 100.2 F (37.9 C)     Temp Source 12/05/19 0024 Oral     SpO2 12/05/19 0024 100 %     Weight --      Height --      Head Circumference --      Peak Flow --      Pain Score 12/05/19 0038 8     Pain Loc --      Pain Edu? --      Excl. in [place]? --     Constitutional: Alert and oriented. Well appearing and in no acute distress. Eyes: Conjunctivae are normal. PERRL. EOMI. Head: Atraumatic. Nose: No congestion/rhinnorhea. Mouth/Throat: Mucous membranes are moist.  Oropharynx non-erythematous. Neck: No stridor.  No meningeal signs.   Cardiovascular: Normal rate, regular rhythm. Good peripheral circulation. Grossly normal heart sounds.   Respiratory: Normal respiratory effort.  No retractions. Lungs CTAB. Gastrointestinal: Soft and nontender. No distention.  Musculoskeletal: No lower extremity tenderness nor edema. No gross deformities of extremities. Neurologic:  Normal speech and language. No gross focal neurologic deficits are appreciated.  Skin:  Skin  is warm, dry and intact. No rash noted. Multiple abscess and surrounding induration in gluteal cleft. One near his rectum is already draining bloody purulent fluid.   ____________________________________________   LABS (all labs ordered are listed, but only abnormal results are displayed)  Labs Reviewed  I-STAT CHEM 8, ED - Abnormal; Notable for the following components:      Result Value   Potassium 3.4 (*)    Creatinine, Ser 0.60 (*)    Glucose, Bld 163 (*)    Calcium, Ion 1.14 (*)    Hemoglobin 11.9 (*)    HCT 35.0 (*)    All other components within normal limits   ____________________________________________  EKG   EKG Interpretation  Date/Time:    Ventricular Rate:    PR  Interval:    QRS Duration:   QT Interval:    QTC Calculation:   R Axis:     Text Interpretation:         ____________________________________________  RADIOLOGY  CT ABDOMEN PELVIS W CONTRAST  Result Date: 12/05/2019 CLINICAL DATA:  Infected skin abscess with swelling and drainage of the sacral area. EXAM: CT ABDOMEN AND PELVIS WITH CONTRAST TECHNIQUE: Multidetector CT imaging of the abdomen and pelvis was performed using the standard protocol following bolus administration of intravenous contrast. CONTRAST:  OMNIPAQUE IOHEXOL 300 MG/ML  SOLN COMPARISON:  None. FINDINGS: Lower chest: Unremarkable. Hepatobiliary: Probable component of fatty deposition in the liver parenchyma. 2.8 cm subcapsular area of increased attenuation or enhancement is identified in the medial parenchyma of segment IV. There is no evidence for gallstones, gallbladder wall thickening, or pericholecystic fluid. No intrahepatic or extrahepatic biliary dilation. Pancreas: No focal mass lesion. No dilatation of the main duct. No intraparenchymal cyst. No peripancreatic edema. Spleen: No splenomegaly. No focal mass lesion. Adrenals/Urinary Tract: No adrenal nodule or mass. Early caliceal contrast excretion noted in the kidneys. Kidneys otherwise unremarkable. No evidence for hydroureter. The urinary bladder appears normal for the degree of distention. Stomach/Bowel: Stomach is unremarkable. No gastric wall thickening. No evidence of outlet obstruction. Duodenum is normally positioned as is the ligament of Treitz. No small bowel wall thickening. No small bowel dilatation. The terminal ileum is normal. The appendix is normal. No gross colonic mass. No colonic wall thickening. Vascular/Lymphatic: No abdominal aortic aneurysm. No abdominal aortic atherosclerotic calcification. There is no gastrohepatic or hepatoduodenal ligament lymphadenopathy. No retroperitoneal or mesenteric lymphadenopathy. No pelvic sidewall lymphadenopathy.  Reproductive: The prostate gland and seminal vesicles are unremarkable. Other: No intraperitoneal free fluid. Musculoskeletal: Edema/inflammation is identified in the midline posterior to the coccyx in tracking inferiorly along the intergluteal fold towards the lower buttock region. No definite drainable abscess at this time although there is probably a 4 x 2 x 2 cm of all vein phlegmon in the midline superficial to the coccyx and near the top of the intergluteal fold. Scattered tiny sclerotic foci in the bony pelvis likely reflect bone islands. IMPRESSION: 1. Subcutaneous edema and skin thickening consistent with cellulitis is identified in the lower back, superficial to the sacrum and tracking down the intergluteal fold toward the inferior buttock region. No drainable rim enhancing abscess at this time but a 2 x 2 x 4 cm area of more confluent edema/soft tissue attenuation is identified at the upper end of the intergluteal fold, likely an evolving phlegmon. Electronically Signed   By: Kennith Center M.D.   On: 12/05/2019 07:14    ____________________________________________   PROCEDURES  Procedure(s) performed:   Marland KitchenMarland KitchenIncision and Drainage  Date/Time: 12/05/2019 7:17 AM Performed by: Marily Memos, MD Authorized by: Marily Memos, MD   Consent:    Consent obtained:  Verbal   Consent given by:  Patient   Risks discussed:  Bleeding, infection, pain and incomplete drainage   Alternatives discussed:  No treatment Location:    Type:  Pilonidal cyst   Size:  3x2 Pre-procedure details:    Skin preparation:  Betadine Anesthesia (see MAR for exact dosages):    Anesthesia method:  Local infiltration   Local anesthetic:  Lidocaine 1% WITH epi Procedure type:    Complexity:  Simple Procedure details:    Incision types:  Stab incision   Scalpel blade:  11   Wound management:  Irrigated with saline   Drainage:  Bloody and purulent   Drainage amount:  Copious   Wound treatment:  Wound left open    Packing materials:  None Post-procedure details:    Patient tolerance of procedure:  Tolerated well, no immediate complications  ____________________________________________   INITIAL IMPRESSION / ASSESSMENT AND PLAN / ED COURSE  Ct to ensure no deep abscesses. Abx/pain meds in mean time. Will I&D if ct reassurring.   Ct without deep spread of abscess. I&D performed with copious bloody purulent drainage. Advised on sits baths and fu if not improving, reasons to return here.    Pertinent labs & imaging results that were available during my care of the patient were reviewed by me and considered in my medical decision making (see chart for details).  A medical screening exam was performed and I feel the patient has had an appropriate workup for their chief complaint at this time and likelihood of emergent condition existing is low. They have been counseled on decision, discharge, follow up and which symptoms necessitate immediate return to the emergency department. They or their family verbally stated understanding and agreement with plan and discharged in stable condition.   ____________________________________________  FINAL CLINICAL IMPRESSION(S) / ED DIAGNOSES  Final diagnoses:  Abscess  Cellulitis, unspecified cellulitis site     MEDICATIONS GIVEN DURING THIS VISIT:  Medications  lidocaine-EPINEPHrine (XYLOCAINE W/EPI) 2 %-1:200000 (PF) injection 10 mL (has no administration in time range)  sodium chloride 0.9 % bolus 1,000 mL (0 mLs Intravenous Stopped 12/05/19 0530)  sulfamethoxazole-trimethoprim (BACTRIM DS) 800-160 MG per tablet 1 tablet (1 tablet Oral Given 12/05/19 0345)  fentaNYL (SUBLIMAZE) injection 50 mcg (50 mcg Intravenous Given 12/05/19 0343)  iohexol (OMNIPAQUE) 300 MG/ML solution 100 mL (100 mLs Intravenous Contrast Given 12/05/19 0624)     NEW OUTPATIENT MEDICATIONS STARTED DURING THIS VISIT:  New Prescriptions   OXYCODONE-ACETAMINOPHEN (PERCOCET) 5-325 MG  TABLET    Take 2 tablets by mouth every 4 (four) hours as needed.   SULFAMETHOXAZOLE-TRIMETHOPRIM (BACTRIM DS) 800-160 MG TABLET    Take 1 tablet by mouth 2 (two) times daily for 7 days.    Note:  This note was prepared with assistance of Dragon voice recognition software. Occasional wrong-word or sound-a-like substitutions may have occurred due to the inherent limitations of voice recognition software.   [place], Barbara Cower, MD 12/05/19 [phone]

## 2019-12-05 NOTE — ED Notes (Signed)
Returned from CT.

## 2020-06-23 ENCOUNTER — Emergency Department (HOSPITAL_BASED_OUTPATIENT_CLINIC_OR_DEPARTMENT_OTHER)
Admission: EM | Admit: 2020-06-23 | Discharge: 2020-06-23 | Discharge status: Against Medical Advice | Payer: No Typology Code available for payment source

## 2020-06-23 ENCOUNTER — Other Ambulatory Visit: Payer: Self-pay

## 2021-01-22 ENCOUNTER — Encounter (HOSPITAL_COMMUNITY): Payer: Self-pay

## 2021-01-22 ENCOUNTER — Ambulatory Visit (HOSPITAL_COMMUNITY)
Admission: EM | Admit: 2021-01-22 | Discharge: 2021-01-22 | Disposition: A | Discharge status: Home / Self Care | Payer: Self-pay | Attending: Family Medicine | Admitting: Family Medicine

## 2021-01-22 ENCOUNTER — Other Ambulatory Visit: Payer: Self-pay

## 2021-01-22 DIAGNOSIS — K219 Gastro-esophageal reflux disease without esophagitis: Secondary | ICD-10-CM

## 2021-01-22 MED ORDER — LIDOCAINE VISCOUS HCL 2 % MT SOLN
15.0000 mL | Freq: Once | OROMUCOSAL | Status: AC
  Administered 2021-01-22: 15 mL via ORAL

## 2021-01-22 MED ORDER — ALUM & MAG HYDROXIDE-SIMETH 200-200-20 MG/5ML PO SUSP
ORAL | Status: AC
  Filled 2021-01-22: qty 30

## 2021-01-22 MED ORDER — OMEPRAZOLE 20 MG PO CPDR: 20.0000 mg | DELAYED_RELEASE_CAPSULE | Freq: Every day | ORAL | 0 refills | Status: DC

## 2021-01-22 MED ORDER — LIDOCAINE VISCOUS HCL 2 % MT SOLN
OROMUCOSAL | Status: AC
  Filled 2021-01-22: qty 15

## 2021-01-22 MED ORDER — ALUM & MAG HYDROXIDE-SIMETH 200-200-20 MG/5ML PO SUSP
30.0000 mL | Freq: Once | ORAL | Status: AC
  Administered 2021-01-22: 30 mL via ORAL

## 2021-01-22 NOTE — ED Provider Notes (Signed)
[facility]    CSN: [id number] Arrival date & time: 01/22/21  9678      History   Chief Complaint Chief Complaint  Patient presents with  . Chest Pain    HPI Alexander Mccann is a 25 y.o. male.   Alexander Mccann presents with complaints of epigastric pain which he woke with this morning, and felt similar to reflux. He had eaten a tomato based meal last night which he feels attributed to reflux. No shortness of breath . No nausea or vomiting. No diaphoresis. No palpitations. No cardiac history. No previous abdominal surgeries. Hasn't taken any medications for symptoms.     ROS per HPI, negative if not otherwise mentioned.      Past Medical History:  Diagnosis Date  . Obesity     Patient Active Problem List   Diagnosis Date Noted  . VIRAL INFECTION 01/19/2008  . ACUTE PHARYNGITIS 01/19/2008  . MORBID OBESITY 01/08/2008  . INGROWN TOENAIL 01/08/2008  . TINEA VERSICOLOR 08/04/2007  . HYPOGONADISM 08/04/2007  . BRONCHITIS NOS 08/04/2007    History reviewed. No pertinent surgical history.     Home Medications    Prior to Admission medications   Medication Sig Start Date End Date Taking? Authorizing Provider  omeprazole (PRILOSEC) 20 MG capsule Take 1 capsule (20 mg total) by mouth daily. 01/22/21  Yes [place], Dorene Grebe B, NP  oxyCODONE-acetaminophen (PERCOCET) 5-325 MG tablet Take 2 tablets by mouth every 4 (four) hours as needed. 12/05/19   Mesner, Barbara Cower, MD  fluticasone (FLONASE) 50 MCG/ACT nasal spray Place 1 spray into both nostrils daily. Patient not taking: Reported on 03/20/2018 12/13/17 12/05/19  Dietrich Pates, PA-C  loratadine (CLARITIN) 10 MG tablet Take 1 tablet (10 mg total) by mouth daily. Patient not taking: Reported on 12/05/2019 06/15/19 12/05/19  Eyvonne Mechanic, PA-C    Family History History reviewed. No pertinent family history.  Social History Social History   Tobacco Use  . Smoking status: Current Every Day Smoker    Packs/day: 1.00     Types: Cigarettes  . Smokeless tobacco: Never Used  Vaping Use  . Vaping Use: Never used  Substance Use Topics  . Alcohol use: Yes    Comment: occasionally drinking  . Drug use: No     Allergies   Patient has no known allergies.   Review of Systems Review of Systems   Physical Exam Triage Vital Signs ED Triage Vitals  Enc Vitals Group     BP 01/22/21 0939 (!) 146/103     Pulse Rate 01/22/21 0939 82     Resp 01/22/21 0939 20     Temp 01/22/21 0939 98 F (36.7 C)     Temp src --      SpO2 01/22/21 0939 97 %     Weight --      Height --      Head Circumference --      Peak Flow --      Pain Score 01/22/21 0937 4     Pain Loc --      Pain Edu? --      Excl. in [place]? --    No data found.  Updated Vital Signs BP (!) 146/103   Pulse 82   Temp 98 F (36.7 C)   Resp 20   SpO2 97%    Physical Exam Constitutional:      Appearance: He is well-developed.  Cardiovascular:     Rate and Rhythm: Normal rate.  Pulmonary:  Effort: Pulmonary effort is normal.  Abdominal:     Tenderness: There is no abdominal tenderness.     Comments: No current tenderness- indicates epigastric pain prior to arrival- resolved with GI cocktail before exam.   Skin:    General: Skin is warm and dry.  Neurological:     Mental Status: He is alert and oriented to person, place, and time.    EKG:  NSR . Previous EKG was available for review. No stwave changes as interpreted by me.     UC Treatments / Results  Labs (all labs ordered are listed, but only abnormal results are displayed) Labs Reviewed - No data to display  EKG   Radiology No results found.  Procedures Procedures (including critical care time)  Medications Ordered in UC Medications  alum & mag hydroxide-simeth (MAALOX/MYLANTA) 200-200-20 MG/5ML suspension 30 mL (30 mLs Oral Given 01/22/21 1007)    And  lidocaine (XYLOCAINE) 2 % viscous mouth solution 15 mL (15 mLs Oral Given 01/22/21 1007)    Initial  Impression / Assessment and Plan / UC Course  I have reviewed the triage vital signs and the nursing notes.  Pertinent labs & imaging results that were available during my care of the patient were reviewed by me and considered in my medical decision making (see chart for details).     GERd symptoms, resolved with gi cocktail in clinic. ekg unremarkable Omeprazole provided, diet recommendations discussed. Work note provided per request. Return precautions provided. Patient verbalized understanding and agreeable to plan.   Final Clinical Impressions(s) / UC Diagnoses   Final diagnoses:  Gastroesophageal reflux disease, unspecified whether esophagitis present     Discharge Instructions     Omeprazole daily.  See diet recommendations to prevent reflux.  Please return for worsening of symptoms- chest pain , difficulty breathing, nausea or vomiting, or otherwise worsening.  Please establish with a primary care provider for long term management of your symptoms.     ED Prescriptions    Medication Sig Dispense Auth. Provider   omeprazole (PRILOSEC) 20 MG capsule Take 1 capsule (20 mg total) by mouth daily. 30 capsule Georgetta Haber, NP     PDMP not reviewed this encounter.   Georgetta Haber, NP 01/22/21 1057

## 2021-01-22 NOTE — ED Triage Notes (Signed)
Pt in with c/o "bad chest feeling" that started around 7 am this morning. Pt states it felt like acid reflux  Denies radiation to arm, jaw, neck, or back, sob

## 2021-01-22 NOTE — Discharge Instructions (Signed)
Omeprazole daily.  See diet recommendations to prevent reflux.  Please return for worsening of symptoms- chest pain , difficulty breathing, nausea or vomiting, or otherwise worsening.  Please establish with a primary care provider for long term management of your symptoms.

## 2021-01-27 ENCOUNTER — Telehealth (HOSPITAL_COMMUNITY): Payer: Self-pay | Admitting: Emergency Medicine

## 2021-01-27 ENCOUNTER — Ambulatory Visit (HOSPITAL_COMMUNITY)
Admission: EM | Admit: 2021-01-27 | Discharge: 2021-01-27 | Disposition: A | Discharge status: Home / Self Care | Payer: No Typology Code available for payment source

## 2021-01-27 NOTE — Telephone Encounter (Signed)
Patient cam to [employer] asking for a note to clear him to do his job-reports this was requested by employer.  Patient had given the return to work note to employer that he received the day of services.  Patient says employer not agreeable to this note, wants to know if he is fit to do job.    Spoke to [employer], np, provided [employer] address, phone number for patient to follow up with .  Patient received information and left

## 2021-03-08 ENCOUNTER — Ambulatory Visit (HOSPITAL_COMMUNITY)
Admission: EM | Admit: 2021-03-08 | Discharge: 2021-03-08 | Disposition: A | Discharge status: Other Acute Inpatient Hospital | Payer: No Typology Code available for payment source

## 2021-03-08 ENCOUNTER — Other Ambulatory Visit: Payer: Self-pay

## 2021-03-08 ENCOUNTER — Other Ambulatory Visit: Payer: Self-pay | Admitting: Occupational Medicine

## 2021-03-08 ENCOUNTER — Ambulatory Visit: Payer: Self-pay

## 2021-03-08 DIAGNOSIS — M25572 Pain in left ankle and joints of left foot: Secondary | ICD-10-CM

## 2021-03-08 DIAGNOSIS — M79672 Pain in left foot: Secondary | ICD-10-CM

## 2021-06-05 ENCOUNTER — Ambulatory Visit (HOSPITAL_COMMUNITY)
Admission: EM | Admit: 2021-06-05 | Discharge: 2021-06-05 | Disposition: A | Discharge status: Home / Self Care | Payer: Self-pay | Attending: Family Medicine | Admitting: Family Medicine

## 2021-06-05 ENCOUNTER — Other Ambulatory Visit: Payer: Self-pay

## 2021-06-05 ENCOUNTER — Encounter (HOSPITAL_COMMUNITY): Payer: Self-pay | Admitting: Emergency Medicine

## 2021-06-05 DIAGNOSIS — H6993 Unspecified Eustachian tube disorder, bilateral: Secondary | ICD-10-CM

## 2021-06-05 DIAGNOSIS — H6983 Other specified disorders of Eustachian tube, bilateral: Secondary | ICD-10-CM | POA: Insufficient documentation

## 2021-06-05 DIAGNOSIS — Z113 Encounter for screening for infections with a predominantly sexual mode of transmission: Secondary | ICD-10-CM

## 2021-06-05 DIAGNOSIS — H6122 Impacted cerumen, left ear: Secondary | ICD-10-CM

## 2021-06-05 MED ORDER — PREDNISONE 20 MG PO TABS: 40.0000 mg | ORAL_TABLET | Freq: Every day | ORAL | 0 refills | Status: DC

## 2021-06-05 MED ORDER — CARBAMIDE PEROXIDE 6.5 % OT SOLN: 5.0000 [drp] | Freq: Two times a day (BID) | OTIC | 0 refills | Status: DC | PRN

## 2021-06-05 MED ORDER — FLUTICASONE PROPIONATE 50 MCG/ACT NA SUSP: 1.0000 | Freq: Two times a day (BID) | NASAL | 2 refills | Status: DC

## 2021-06-05 NOTE — ED Provider Notes (Signed)
[facility]    CSN: [id number] Arrival date & time: 06/05/21  6283      History   Chief Complaint Chief Complaint  Patient presents with   Otalgia   SEXUALLY TRANSMITTED DISEASE    HPI Alexander Mccann is a 25 y.o. male.   Patient presenting today with acute on chronic bilateral ear pressure, popping that worsened 2 days ago.  States he got shower water into his left ear and now has pressure, fullness and cannot hear at all from the left ear.  He then was blowing his nose and felt a pop in his right ear and has had muffled hearing and crackling in this ear since.  Denies drainage from the ears, trauma to the ears, fever, chills, recent illness.  Has not tried anything over-the-counter for symptoms thus far.  He would also like STD screening while he is here.  He is asymptomatic in this regard with no known exposures.   Past Medical History:  Diagnosis Date   Obesity     Patient Active Problem List   Diagnosis Date Noted   VIRAL INFECTION 01/19/2008   ACUTE PHARYNGITIS 01/19/2008   MORBID OBESITY 01/08/2008   INGROWN TOENAIL 01/08/2008   TINEA VERSICOLOR 08/04/2007   HYPOGONADISM 08/04/2007   BRONCHITIS NOS 08/04/2007    History reviewed. No pertinent surgical history.     Home Medications    Prior to Admission medications   Medication Sig Start Date End Date Taking? Authorizing Provider  carbamide peroxide (DEBROX) 6.5 % OTIC solution Place 5 drops into both ears 2 (two) times daily as needed. 06/05/21  Yes Particia Nearing, PA-C  fluticasone [facility]) 50 MCG/ACT nasal spray Place 1 spray into both nostrils 2 (two) times daily. 06/05/21  Yes Particia Nearing, PA-C  predniSONE (DELTASONE) 20 MG tablet Take 2 tablets (40 mg total) by mouth daily with breakfast. 06/05/21  Yes Particia Nearing, PA-C  omeprazole (PRILOSEC) 20 MG capsule Take 1 capsule (20 mg total) by mouth daily. Patient not taking: Reported on 06/05/2021 01/22/21   Linus Mako  B, NP  oxyCODONE-acetaminophen (PERCOCET) 5-325 MG tablet Take 2 tablets by mouth every 4 (four) hours as needed. Patient not taking: Reported on 06/05/2021 12/05/19   Mesner, Barbara Cower, MD  loratadine (CLARITIN) 10 MG tablet Take 1 tablet (10 mg total) by mouth daily. Patient not taking: Reported on 12/05/2019 06/15/19 12/05/19  Eyvonne Mechanic, PA-C    Family History Family History  Problem Relation Age of Onset   Diabetes Mother    Hypertension Mother     Social History Social History   Tobacco Use   Smoking status: Every Day    Packs/day: 1.00    Pack years: 0.00    Types: Cigarettes   Smokeless tobacco: Never  Vaping Use   Vaping Use: Never used  Substance Use Topics   Alcohol use: Not Currently    Comment: occasionally drinking   Drug use: No     Allergies   Patient has no known allergies.   Review of Systems Review of Systems Per HPI  Physical Exam Triage Vital Signs ED Triage Vitals  Enc Vitals Group     BP 06/05/21 0913 (!) 132/96     Pulse Rate 06/05/21 0913 77     Resp 06/05/21 0913 20     Temp 06/05/21 0913 98.4 F (36.9 C)     Temp Source 06/05/21 0913 Oral     SpO2 06/05/21 0913 99 %  Weight --      Height --      Head Circumference --      Peak Flow --      Pain Score 06/05/21 0910 4     Pain Loc --      Pain Edu? --      Excl. in [place]? --    No data found.  Updated Vital Signs BP (!) 132/96 (BP Location: Right Arm) Comment (BP Location): large cuff  Pulse 77   Temp 98.4 F (36.9 C) (Oral)   Resp 20   SpO2 99%   Visual Acuity Right Eye Distance:   Left Eye Distance:   Bilateral Distance:    Right Eye Near:   Left Eye Near:    Bilateral Near:     Physical Exam Vitals and nursing note reviewed.  Constitutional:      Appearance: Normal appearance.  HENT:     Head: Atraumatic.     Ears:     Comments: Bilateral middle ear effusion, cerumen impaction to left EAC    Mouth/Throat:     Mouth: Mucous membranes are moist.      Pharynx: Oropharynx is clear.  Eyes:     Extraocular Movements: Extraocular movements intact.     Conjunctiva/sclera: Conjunctivae normal.  Cardiovascular:     Rate and Rhythm: Normal rate and regular rhythm.  Pulmonary:     Effort: Pulmonary effort is normal.     Breath sounds: Normal breath sounds.  Abdominal:     General: Bowel sounds are normal. There is no distension.     Palpations: Abdomen is soft.     Tenderness: There is no abdominal tenderness. There is no guarding.  Genitourinary:    Comments: GU exam deferred, self swab performed Musculoskeletal:        General: Normal range of motion.     Cervical back: Normal range of motion and neck supple.  Skin:    General: Skin is warm and dry.  Neurological:     General: No focal deficit present.     Mental Status: He is oriented to person, place, and time.  Psychiatric:        Mood and Affect: Mood normal.        Thought Content: Thought content normal.        Judgment: Judgment normal.   UC Treatments / Results  Labs (all labs ordered are listed, but only abnormal results are displayed) Labs Reviewed  RPR  HIV ANTIBODY (ROUTINE TESTING W REFLEX)  CYTOLOGY, (ORAL, ANAL, URETHRAL) ANCILLARY ONLY    EKG   Radiology No results found.  Procedures Procedures (including critical care time)  Medications Ordered in UC Medications - No data to display  Initial Impression / Assessment and Plan / UC Course  I have reviewed the triage vital signs and the nursing notes.  Pertinent labs & imaging results that were available during my care of the patient were reviewed by me and considered in my medical decision making (see chart for details).     Suspect his chronic ear symptoms related to eustachian tube dysfunction, discussed short prednisone burst for the immediate and Flonase, Sudafed for ongoing maintenance with this.  Antihistamines additionally recommended as often this is related to uncontrolled seasonal allergies.   Debrox drops also sent over for maintenance of cerumen impaction.  Lavage performed today with clearance of left cerumen impaction, TM benign and visualized post lavage.  Cytology swab pending, was unable to collect HIV and syphilis blood  work due to dehydration.  Patient is aware to drink plenty of water and either follow-up here or with his primary care provider for collection of these labs for screening purposes.  Final Clinical Impressions(s) / UC Diagnoses   Final diagnoses:  Impacted cerumen of left ear  Eustachian tube dysfunction, bilateral  Screening for STD (sexually transmitted disease)   Discharge Instructions   None    ED Prescriptions     Medication Sig Dispense Auth. Provider   carbamide peroxide (DEBROX) 6.5 % OTIC solution Place 5 drops into both ears 2 (two) times daily as needed. 15 mL Particia Nearing, PA-C   predniSONE (DELTASONE) 20 MG tablet Take 2 tablets (40 mg total) by mouth daily with breakfast. 6 tablet Particia Nearing, PA-C   fluticasone [facility]) 50 MCG/ACT nasal spray Place 1 spray into both nostrils 2 (two) times daily. 16 g Particia Nearing, [place]      PDMP not reviewed this encounter.   Particia Nearing, [place] 06/05/21 1048

## 2021-06-05 NOTE — ED Triage Notes (Signed)
Left ear pain for 2 years intermittently.  Significant pain started 2 days ago.  Water got in ear while in shower.    Std evaluation.  Denies symptoms.  Wants to "get checked up"

## 2021-06-06 LAB — CYTOLOGY, (ORAL, ANAL, URETHRAL) ANCILLARY ONLY
Chlamydia: POSITIVE — AB
Comment: NEGATIVE
Comment: NEGATIVE
Comment: NORMAL
Neisseria Gonorrhea: NEGATIVE
Trichomonas: POSITIVE — AB

## 2021-06-07 ENCOUNTER — Telehealth: Payer: Self-pay | Admitting: *Deleted

## 2021-06-07 ENCOUNTER — Telehealth (HOSPITAL_COMMUNITY): Payer: Self-pay | Admitting: Emergency Medicine

## 2021-06-07 ENCOUNTER — Ambulatory Visit: Payer: Self-pay | Admitting: Podiatry

## 2021-06-07 MED ORDER — DOXYCYCLINE HYCLATE 100 MG PO CAPS: 100.0000 mg | ORAL_CAPSULE | Freq: Two times a day (BID) | ORAL | 0 refills | Status: AC

## 2021-06-07 MED ORDER — METRONIDAZOLE 500 MG PO TABS: 2000.0000 mg | ORAL_TABLET | Freq: Once | ORAL | 0 refills | Status: AC

## 2021-06-07 NOTE — Telephone Encounter (Signed)
Patient missed his appointment for today, would like to reschedule.

## 2021-12-14 IMAGING — DX DG FOOT COMPLETE 3+V*L*
3 series · 3 of 3 positions shown · non-contrast
Comparison: None.

CLINICAL DATA: Inversion injury to the RIGHT foot and ankle
yesterday, persistent pain. Initial encounter.

EXAM:
LEFT FOOT - COMPLETE 3+ VIEW

[foot ap]
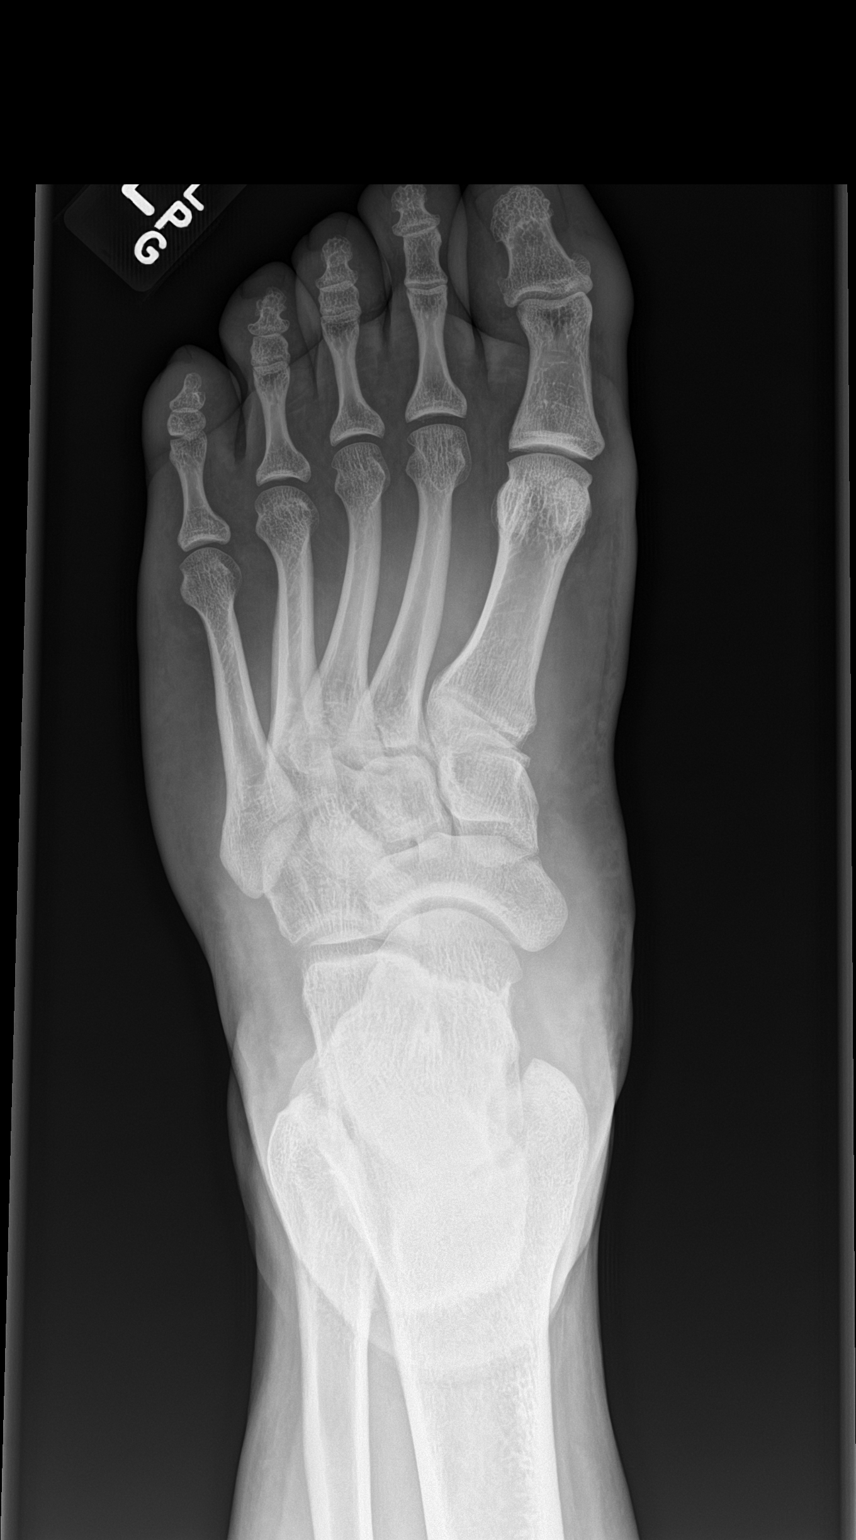

[foot obl]
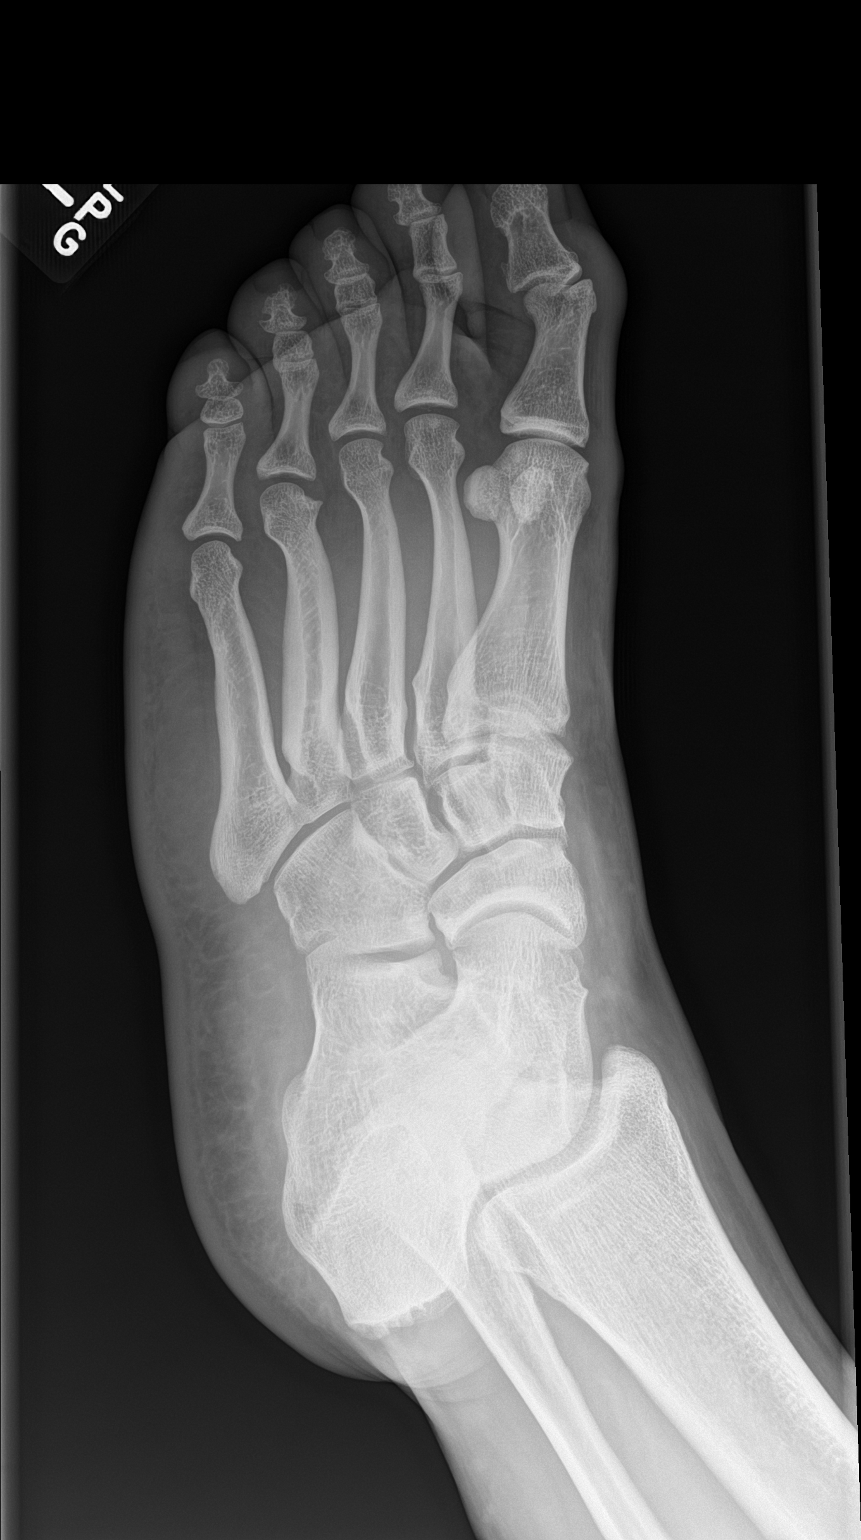

[foot lat]
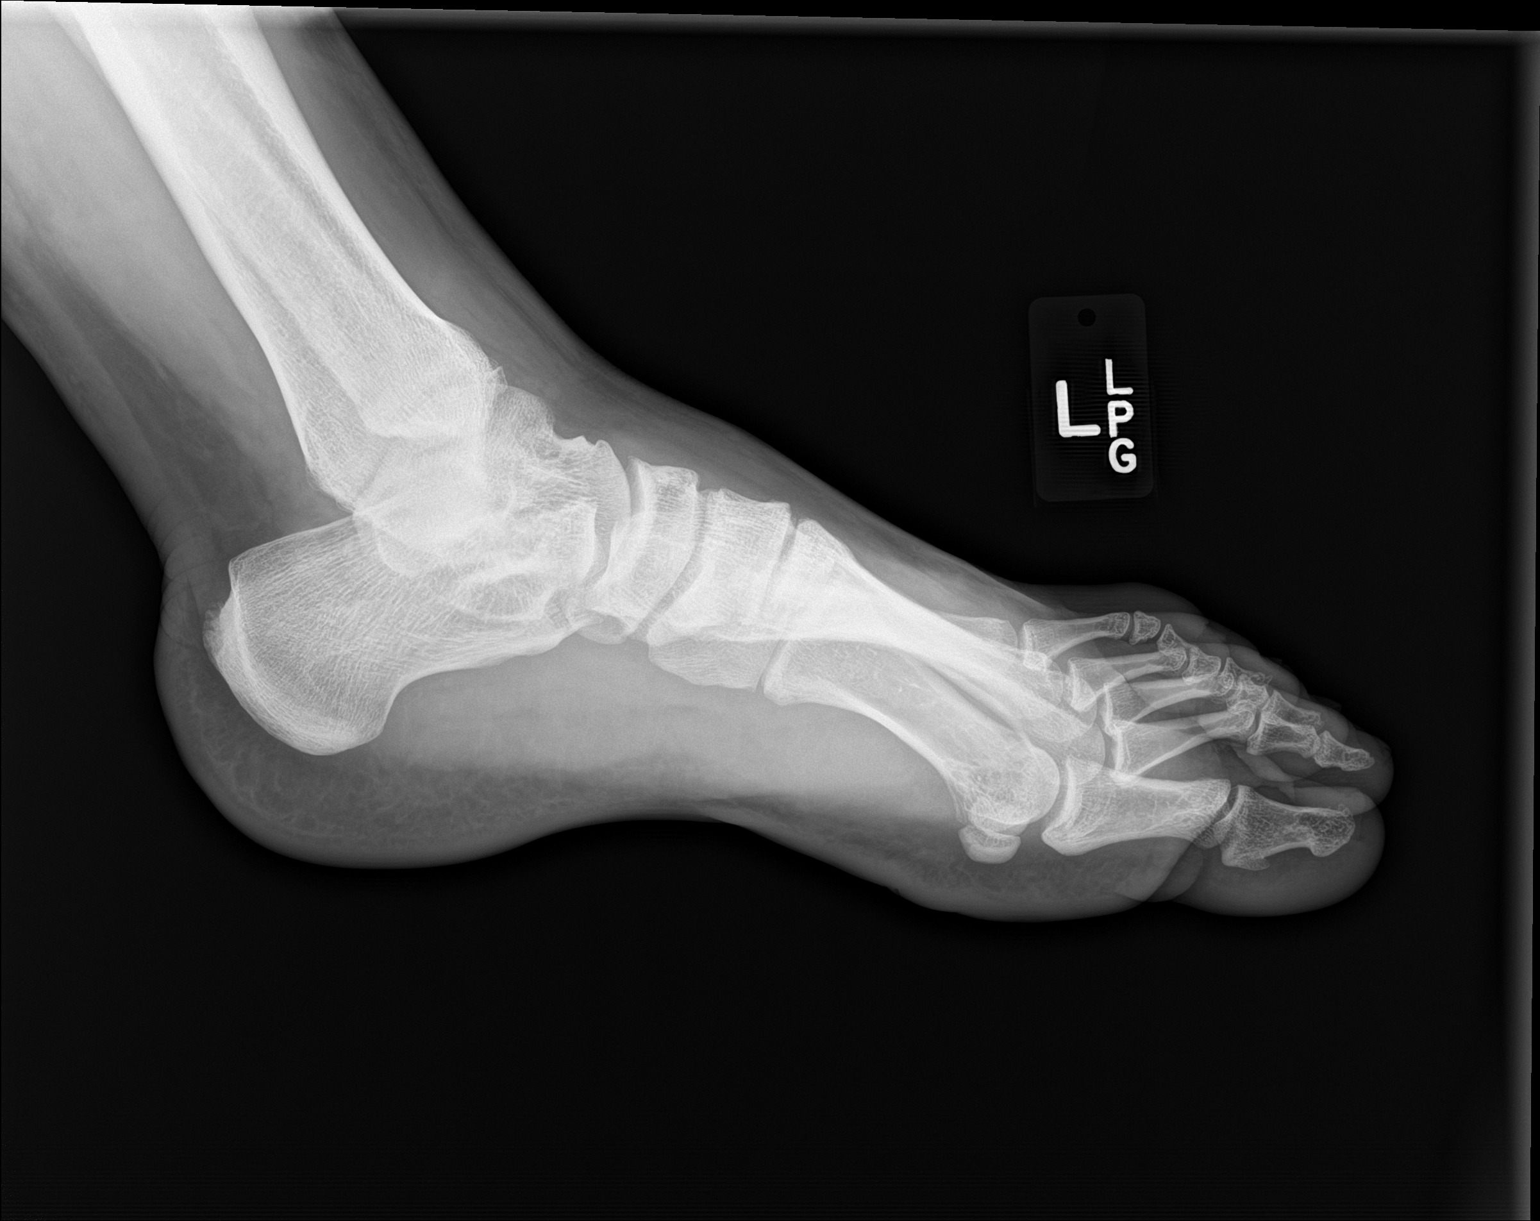

[3 of 3 positions shown; findings below may reference images not displayed]

FINDINGS: Diffuse soft tissue swelling. No evidence of acute fracture or
dislocation. Joint spaces well preserved. Well-preserved bone
mineral density. No intrinsic osseous abnormalities.
IMPRESSION: No osseous abnormality.

## 2022-07-30 ENCOUNTER — Other Ambulatory Visit: Payer: Self-pay | Admitting: Family Medicine

## 2022-07-30 ENCOUNTER — Ambulatory Visit
Admission: RE | Admit: 2022-07-30 | Discharge: 2022-07-30 | Disposition: A | Discharge status: Home / Self Care | Payer: No Typology Code available for payment source | Source: Ambulatory Visit | Attending: Family Medicine | Admitting: Family Medicine

## 2022-07-30 DIAGNOSIS — M25571 Pain in right ankle and joints of right foot: Secondary | ICD-10-CM

## 2022-07-30 DIAGNOSIS — M79671 Pain in right foot: Secondary | ICD-10-CM

## 2023-05-14 ENCOUNTER — Ambulatory Visit (HOSPITAL_COMMUNITY)
Admission: EM | Admit: 2023-05-14 | Discharge: 2023-05-14 | Disposition: A | Discharge status: Home / Self Care | Payer: Self-pay | Attending: Emergency Medicine | Admitting: Emergency Medicine

## 2023-05-14 ENCOUNTER — Encounter (HOSPITAL_COMMUNITY): Payer: Self-pay | Admitting: Emergency Medicine

## 2023-05-14 DIAGNOSIS — Z113 Encounter for screening for infections with a predominantly sexual mode of transmission: Secondary | ICD-10-CM | POA: Insufficient documentation

## 2023-05-14 LAB — HIV ANTIBODY (ROUTINE TESTING W REFLEX): HIV Screen 4th Generation wRfx: NONREACTIVE

## 2023-05-14 NOTE — ED Provider Notes (Signed)
[facility]    CSN: [id number] Arrival date & time: 05/14/23  1510      History   Chief Complaint Chief Complaint  Patient presents with   SEXUALLY TRANSMITTED DISEASE    HPI Alexander Mccann is a 27 y.o. male.  Here for STD testing He is worried due to oral intercourse last night with a new partner. Feels penile irritation but intermittently. No discharge, rash, itching, dysuria. Would like blood work as well  History of chlamydia and trichomonas 2 years ago, at that time had no symptoms   Past Medical History:  Diagnosis Date   Obesity     Patient Active Problem List   Diagnosis Date Noted   VIRAL INFECTION 01/19/2008   ACUTE PHARYNGITIS 01/19/2008   MORBID OBESITY 01/08/2008   INGROWN TOENAIL 01/08/2008   TINEA VERSICOLOR 08/04/2007   HYPOGONADISM 08/04/2007   BRONCHITIS NOS 08/04/2007    History reviewed. No pertinent surgical history.     Home Medications    Prior to Admission medications   Medication Sig Start Date End Date Taking? Authorizing Provider  loratadine (CLARITIN) 10 MG tablet Take 1 tablet (10 mg total) by mouth daily. Patient not taking: Reported on 12/05/2019 06/15/19 12/05/19  Eyvonne Mechanic, PA-C    Family History Family History  Problem Relation Age of Onset   Diabetes Mother    Hypertension Mother     Social History Social History   Tobacco Use   Smoking status: Every Day    Packs/day: 1    Types: Cigarettes   Smokeless tobacco: Never  Vaping Use   Vaping Use: Never used  Substance Use Topics   Alcohol use: Not Currently    Comment: occasionally drinking   Drug use: No     Allergies   Patient has no known allergies.   Review of Systems Review of Systems As per HPI  Physical Exam Triage Vital Signs ED Triage Vitals  Enc Vitals Group     BP 05/14/23 1527 (!) 154/97     Pulse Rate 05/14/23 1527 (!) 101     Resp 05/14/23 1527 17     Temp 05/14/23 1527 97.8 F (36.6 C)     Temp Source 05/14/23 1527  Oral     SpO2 05/14/23 1527 97 %     Weight --      Height --      Head Circumference --      Peak Flow --      Pain Score 05/14/23 1526 0     Pain Loc --      Pain Edu? --      Excl. in GC? --    No data found.  Updated Vital Signs BP (!) 154/97 (BP Location: Right Arm)   Pulse (!) 101   Temp 97.8 F (36.6 C) (Oral)   Resp 17   SpO2 97%   Physical Exam Vitals and nursing note reviewed.  Constitutional:      General: He is not in acute distress.    Appearance: Normal appearance.  HENT:     Mouth/Throat:     Pharynx: Oropharynx is clear.  Cardiovascular:     Rate and Rhythm: Normal rate and regular rhythm.     Pulses: Normal pulses.  Pulmonary:     Effort: Pulmonary effort is normal.  Neurological:     Mental Status: He is alert and oriented to person, place, and time.      UC Treatments / Results  Labs (all labs  ordered are listed, but only abnormal results are displayed) Labs Reviewed  HIV ANTIBODY (ROUTINE TESTING W REFLEX)  RPR  CYTOLOGY, (ORAL, ANAL, URETHRAL) ANCILLARY ONLY    EKG   Radiology No results found.  Procedures Procedures (including critical care time)  Medications Ordered in UC Medications - No data to display  Initial Impression / Assessment and Plan / UC Course  I have reviewed the triage vital signs and the nursing notes.  Pertinent labs & imaging results that were available during my care of the patient were reviewed by me and considered in my medical decision making (see chart for details).  Cytology swab, HIV/RPR pending.  Discussion with patient about STDs. All questions answered. Provided with additional info in AVS and condom baggie with pamphlet. Advised return for retest with any new partner or new symptoms.   Final Clinical Impressions(s) / UC Diagnoses   Final diagnoses:  Screen for STD (sexually transmitted disease)     Discharge Instructions      We will call you if anything on your swab returns positive.  You can also see these results on MyChart. Please abstain from sexual intercourse until your results return.    ED Prescriptions   None    PDMP not reviewed this encounter.   Daiden Coltrane, Lurena Joiner, [place] 05/14/23 1622

## 2023-05-14 NOTE — Discharge Instructions (Addendum)
We will call you if anything on your swab returns positive. You can also see these results on MyChart. Please abstain from sexual intercourse until your results return. 

## 2023-05-14 NOTE — ED Triage Notes (Addendum)
Pt reports that he had oral sex last night performed on him last night. Reports having to urinate a lot and "feels warm right now while I am sitting here". Pt very anxious during triage.

## 2023-05-15 ENCOUNTER — Telehealth (HOSPITAL_COMMUNITY): Payer: Self-pay

## 2023-05-15 LAB — RPR: RPR Ser Ql: NONREACTIVE

## 2023-05-15 NOTE — Telephone Encounter (Signed)
Informed by the front desk that Patient called in to inquire about testing results he saw on mychart.   Called and informed the patient that his swab results have not returned as of yet. Patient confirmed that he has seen is negative RPR and HIV results.   Patient states he will keep an eye for his swab results. Informed the Patient that the swab typically takes 2-3 days to return a result. Patient informed that if the results do not show by tomorrow, Friday, he should have his results on Monday. Patient informed to feel free to call us should he have any further questions.   Patient verbalized understanding

## 2023-05-16 LAB — CYTOLOGY, (ORAL, ANAL, URETHRAL) ANCILLARY ONLY
Chlamydia: NEGATIVE
Comment: NEGATIVE
Comment: NEGATIVE
Comment: NORMAL
Neisseria Gonorrhea: NEGATIVE
Trichomonas: NEGATIVE

## 2023-05-31 ENCOUNTER — Encounter (HOSPITAL_COMMUNITY): Payer: Self-pay

## 2023-05-31 ENCOUNTER — Ambulatory Visit (HOSPITAL_COMMUNITY)
Admission: EM | Admit: 2023-05-31 | Discharge: 2023-05-31 | Disposition: A | Discharge status: Home / Self Care | Payer: Medicaid Other | Attending: Emergency Medicine | Admitting: Emergency Medicine

## 2023-05-31 DIAGNOSIS — B356 Tinea cruris: Secondary | ICD-10-CM

## 2023-05-31 DIAGNOSIS — L0501 Pilonidal cyst with abscess: Secondary | ICD-10-CM | POA: Diagnosis not present

## 2023-05-31 MED ORDER — NAPROXEN SODIUM 550 MG PO TABS: 550.0000 mg | ORAL_TABLET | Freq: Two times a day (BID) | ORAL | 0 refills | Status: DC

## 2023-05-31 MED ORDER — DOXYCYCLINE HYCLATE 100 MG PO CAPS: 100.0000 mg | ORAL_CAPSULE | Freq: Two times a day (BID) | ORAL | 0 refills | Status: DC

## 2023-05-31 MED ORDER — CLOTRIMAZOLE 1 % EX CREA: TOPICAL_CREAM | CUTANEOUS | 0 refills | Status: DC

## 2023-05-31 NOTE — ED Triage Notes (Signed)
Pt reports cyst between his butt cheeks x 1 week.  Pt reports bumps near his groin area. Pt requested STI; however was just tested 2 weeks ago.

## 2023-05-31 NOTE — ED Provider Notes (Signed)
[facility]    CSN: [id number] Arrival date & time: 05/31/23  1638      History   Chief Complaint Chief Complaint  Patient presents with   Cyst    HPI Marilyn Osejo is a 27 y.o. male.   Painful to sit, for 7 days, has similar area last year, no treatment,    Bumps near groin, 1 week ago, anti itch, not helpful, not painful, does not shave,     Past Medical History:  Diagnosis Date   Obesity     Patient Active Problem List   Diagnosis Date Noted   VIRAL INFECTION 01/19/2008   ACUTE PHARYNGITIS 01/19/2008   MORBID OBESITY 01/08/2008   INGROWN TOENAIL 01/08/2008   TINEA VERSICOLOR 08/04/2007   HYPOGONADISM 08/04/2007   BRONCHITIS NOS 08/04/2007    History reviewed. No pertinent surgical history.     Home Medications    Prior to Admission medications   Medication Sig Start Date End Date Taking? Authorizing Provider  loratadine (CLARITIN) 10 MG tablet Take 1 tablet (10 mg total) by mouth daily. Patient not taking: Reported on 12/05/2019 06/15/19 12/05/19  Eyvonne Mechanic, PA-C    Family History Family History  Problem Relation Age of Onset   Diabetes Mother    Hypertension Mother     Social History Social History   Tobacco Use   Smoking status: Every Day    Packs/day: 1    Types: Cigarettes   Smokeless tobacco: Never  Vaping Use   Vaping Use: Never used  Substance Use Topics   Alcohol use: Not Currently    Comment: occasionally drinking   Drug use: No     Allergies   Patient has no known allergies.   Review of Systems Review of Systems   Physical Exam Triage Vital Signs ED Triage Vitals [05/31/23 1727]  Enc Vitals Group     BP (!) 142/93     Pulse Rate 97     Resp 18     Temp 98.1 F (36.7 C)     Temp Source Oral     SpO2 97 %     Weight      Height      Head Circumference      Peak Flow      Pain Score      Pain Loc      Pain Edu?      Excl. in GC?    No data found.  Updated Vital Signs BP (!) 142/93 (BP  Location: Left Arm)   Pulse 97   Temp 98.1 F (36.7 C) (Oral)   Resp 18   SpO2 97%   Visual Acuity Right Eye Distance:   Left Eye Distance:   Bilateral Distance:    Right Eye Near:   Left Eye Near:    Bilateral Near:     Physical Exam   UC Treatments / Results  Labs (all labs ordered are listed, but only abnormal results are displayed) Labs Reviewed  CYTOLOGY, (ORAL, ANAL, URETHRAL) ANCILLARY ONLY    EKG   Radiology No results found.  Procedures Procedures (including critical care time)  Medications Ordered in UC Medications - No data to display  Initial Impression / Assessment and Plan / UC Course  I have reviewed the triage vital signs and the nursing notes.  Pertinent labs & imaging results that were available during my care of the patient were reviewed by me and considered in my medical decision making (see chart for details).     ***  Final Clinical Impressions(s) / UC Diagnoses   Final diagnoses:  None     Discharge Instructions      Labs pending 2-3 days, you will be contacted if positive for any sti and treatment will be sent to the pharmacy, you will have to return to the clinic if positive for gonorrhea to receive treatment   Please refrain from having sex until labs results, if positive please refrain from having sex until treatment complete and symptoms resolve   If positive for Chlamydia  gonorrhea or trichomoniasis please notify partner or partners so they may tested as well  Moving forward, it is recommended you use some form of protection against the transmission of sti infections  such as condoms or dental dams with each sexual encounter     ED Prescriptions   None    PDMP not reviewed this encounter.

## 2023-05-31 NOTE — Discharge Instructions (Addendum)
Today you were evaluated for abscess - Abscess is firm and underneath the skin that is unable to be drained today - Start doxycycline every morning and every evening for 7 days which will help clear infection as infection clears pain will lessen - Abscess may open up and drain on its own as abscess drains pain will lessen - Take naproxen every morning and every evening to help manage pain - Take Tylenol 500 mg every 6 hours to help minimize pain - Hold warm compresses or hold warm rags or complete warm soaks to help soften tissue and to help manage pain - Sit on soft pillows to help provide comfort to the bottom - If you have any concerns regarding healing please follow-up here at the urgent care for reevaluation - As you have had an cyst in the same location prior you most likely have a sac underneath the skin which will need removal, you have been given information to the general surgeon for further evaluation and follow-up  The area to your groin has been evaluated - This is consistent with fungus or commonly known as jock itch - Apply clotrimazole cream twice daily until area has cleared - Clean the area daily with soap and water and pat dry - you will need to keep area dry and to help rash clear faster you will need to check the area multiple times throughout the day - You do not need to remove here in the affected area -You may follow-up with his urgent care for any further concerns - Information is in your packet regarding this information

## 2023-06-15 ENCOUNTER — Ambulatory Visit (HOSPITAL_COMMUNITY)
Admission: EM | Admit: 2023-06-15 | Discharge: 2023-06-15 | Disposition: A | Discharge status: Home / Self Care | Payer: Medicaid Other | Attending: Family Medicine | Admitting: Family Medicine

## 2023-06-15 ENCOUNTER — Encounter (HOSPITAL_COMMUNITY): Payer: Self-pay

## 2023-06-15 ENCOUNTER — Telehealth (HOSPITAL_COMMUNITY): Payer: Self-pay | Admitting: Family Medicine

## 2023-06-15 DIAGNOSIS — E119 Type 2 diabetes mellitus without complications: Secondary | ICD-10-CM | POA: Insufficient documentation

## 2023-06-15 DIAGNOSIS — R739 Hyperglycemia, unspecified: Secondary | ICD-10-CM | POA: Diagnosis present

## 2023-06-15 DIAGNOSIS — R81 Glycosuria: Secondary | ICD-10-CM | POA: Insufficient documentation

## 2023-06-15 LAB — POCT FASTING CBG KUC MANUAL ENTRY: POCT Glucose (KUC): 491 mg/dL — AB (ref 70–99)

## 2023-06-15 LAB — COMPREHENSIVE METABOLIC PANEL
ALT: 47 U/L — ABNORMAL HIGH (ref 0–44)
AST: 31 U/L (ref 15–41)
Albumin: 3.9 g/dL (ref 3.5–5.0)
Alkaline Phosphatase: 104 U/L (ref 38–126)
Anion gap: 12 (ref 5–15)
BUN: 9 mg/dL (ref 6–20)
CO2: 21 mmol/L — ABNORMAL LOW (ref 22–32)
Calcium: 9.1 mg/dL (ref 8.9–10.3)
Chloride: 97 mmol/L — ABNORMAL LOW (ref 98–111)
Creatinine, Ser: 0.73 mg/dL (ref 0.61–1.24)
GFR, Estimated: >60 mL/min (ref >60)
Glucose, Bld: 487 mg/dL — ABNORMAL HIGH (ref 70–99)
Potassium: 4 mmol/L (ref 3.5–5.1)
Sodium: 130 mmol/L — ABNORMAL LOW (ref 135–145)
Total Bilirubin: 0.5 mg/dL (ref 0.3–1.2)
Total Protein: 7.2 g/dL (ref 6.5–8.1)

## 2023-06-15 LAB — POCT URINALYSIS DIP (MANUAL ENTRY)
Bilirubin, UA: NEGATIVE
Blood, UA: NEGATIVE
Glucose, UA: >=1000 mg/dL — AB
Ketones, POC UA: NEGATIVE mg/dL
Leukocytes, UA: NEGATIVE
Nitrite, UA: NEGATIVE
Protein Ur, POC: 100 mg/dL — AB
Spec Grav, UA: 1.02 (ref 1.010–1.025)
Urobilinogen, UA: 0.2 E.U./dL
pH, UA: 6 (ref 5.0–8.0)

## 2023-06-15 LAB — RPR: RPR Ser Ql: NONREACTIVE

## 2023-06-15 LAB — HIV ANTIBODY (ROUTINE TESTING W REFLEX): HIV Screen 4th Generation wRfx: NONREACTIVE

## 2023-06-15 LAB — HEMOGLOBIN A1C
Hgb A1c MFr Bld: 11.1 % — ABNORMAL HIGH (ref 4.8–5.6)
Mean Plasma Glucose: 271.87 mg/dL

## 2023-06-15 MED ORDER — LISINOPRIL 5 MG PO TABS: 5.0000 mg | ORAL_TABLET | Freq: Every day | ORAL | 0 refills | Status: DC

## 2023-06-15 MED ORDER — FLUCONAZOLE 150 MG PO TABS: 150.0000 mg | ORAL_TABLET | Freq: Every day | ORAL | 0 refills | Status: DC

## 2023-06-15 MED ORDER — METFORMIN HCL 500 MG PO TABS: 500.0000 mg | ORAL_TABLET | Freq: Two times a day (BID) | ORAL | 0 refills | Status: DC

## 2023-06-15 NOTE — ED Triage Notes (Signed)
Patient having penile irritation and redness on the head of the penis for 2 weeks. Tried an otc cream with moderate relief and then the redness came back.  Slight abdominal pain with urinary frequency.

## 2023-06-15 NOTE — Telephone Encounter (Signed)
  The patient confirmed his name and DOB. Lab results discussed. A1C elevated with elevated anion gap. Pseudohyponatremia.  ED visit was discussed again, but he declined. He will self-hydrate, start Metformin, and connect with PCP on Monday. He will return to the ED should he feel sick. He was appreciative of the call.

## 2023-06-15 NOTE — Discharge Instructions (Addendum)
It was nice seeing you. Your urine is negative for infection. However, you have a lot of glucose in your urine which could suggest diabetes. I have done some diabetes test and will call you with results tomorrow. I will likely start you on Metformin and blood pressure medication. I will contact you with all results soon.

## 2023-06-15 NOTE — ED Provider Notes (Signed)
[facility]    CSN: [id number] Arrival date & time: 06/15/23  1001      History   Chief Complaint Chief Complaint  Patient presents with   Urinary Tract Infection    HPI Alexander Mccann is a 27 y.o. male.   The history is provided by the patient. No language interpreter was used.  Urinary Tract Infection Presenting symptoms: dysuria   Presenting symptoms: no penile discharge, no penile pain, no scrotal pain and no swelling   Presenting symptoms comment:  Occasional dysuria Context: after urination   Relieved by: Calmoseptin. Worsened by:  Nothing Associated symptoms: abdominal pain, nausea, penile redness, urinary frequency and urinary hesitation   Associated symptoms: no fever, no genital itching, no penile swelling, no urinary incontinence, no urinary retention and no vomiting   Risk factors: does not have multiple sexual partners   Risk factors comment:  Sexually with his fiancee x 1 year Hypertension Chronicity: New to patient. Associated symptoms include abdominal pain.     Past Medical History:  Diagnosis Date   Obesity     Patient Active Problem List   Diagnosis Date Noted   VIRAL INFECTION 01/19/2008   ACUTE PHARYNGITIS 01/19/2008   MORBID OBESITY 01/08/2008   INGROWN TOENAIL 01/08/2008   TINEA VERSICOLOR 08/04/2007   HYPOGONADISM 08/04/2007   BRONCHITIS NOS 08/04/2007    History reviewed. No pertinent surgical history.     Home Medications    Prior to Admission medications   Medication Sig Start Date End Date Taking? Authorizing Provider  fluconazole (DIFLUCAN) 150 MG tablet Take 1 tablet (150 mg total) by mouth daily. 06/15/23  Yes Janit Pagan T, MD  lisinopril (ZESTRIL) 5 MG tablet Take 1 tablet (5 mg total) by mouth daily. 06/15/23  Yes Doreene Eland, MD  metFORMIN (GLUCOPHAGE) 500 MG tablet Take 1 tablet (500 mg total) by mouth 2 (two) times daily with a meal. 06/15/23  Yes Doreene Eland, MD  clotrimazole (LOTRIMIN) 1 %  cream Apply to affected area 2 times daily 05/31/23   Valinda Hoar, NP  doxycycline (VIBRAMYCIN) 100 MG capsule Take 1 capsule (100 mg total) by mouth 2 (two) times daily. 05/31/23   White, Elita Boone, NP  naproxen sodium (ANAPROX DS) 550 MG tablet Take 1 tablet (550 mg total) by mouth 2 (two) times daily with a meal. 05/31/23   White, Elita Boone, NP  loratadine (CLARITIN) 10 MG tablet Take 1 tablet (10 mg total) by mouth daily. Patient not taking: Reported on 12/05/2019 06/15/19 12/05/19  Eyvonne Mechanic, PA-C    Family History Family History  Problem Relation Age of Onset   Diabetes Mother    Hypertension Mother     Social History Social History   Tobacco Use   Smoking status: Every Day    Current packs/day: 1.00    Types: Cigarettes   Smokeless tobacco: Never  Vaping Use   Vaping status: Never Used  Substance Use Topics   Alcohol use: Not Currently    Comment: occasionally drinking   Drug use: No     Allergies   Patient has no known allergies.   Review of Systems Review of Systems  Constitutional:  Negative for fever.  Gastrointestinal:  Positive for abdominal pain and nausea. Negative for vomiting.  Genitourinary:  Positive for dysuria, frequency and hesitancy. Negative for bladder incontinence, penile discharge, penile pain and penile swelling.  All other systems reviewed and are negative.    Physical Exam Triage Vital Signs ED Triage  Vitals  Encounter Vitals Group     BP 06/15/23 1013 (!) 150/102     Systolic BP Percentile --      Diastolic BP Percentile --      Pulse Rate 06/15/23 1013 (!) 102     Resp 06/15/23 1013 16     Temp 06/15/23 1013 98.3 F (36.8 C)     Temp Source 06/15/23 1013 Oral     SpO2 06/15/23 1013 94 %     Weight 06/15/23 1012 259 lb 6.4 oz (117.7 kg)     Height 06/15/23 1012 5\' 7"  (1.702 m)     Head Circumference --      Peak Flow --      Pain Score 06/15/23 1011 0     Pain Loc --      Pain Education --      Exclude from Growth  Chart --    No data found.  Updated Vital Signs BP (!) 150/102 (BP Location: Right Arm)   Pulse (!) 102   Temp 98.3 F (36.8 C) (Oral)   Resp 16   Ht 5\' 7"  (1.702 m)   Wt 117.7 kg   SpO2 94%   BMI 40.63 kg/m   Visual Acuity Right Eye Distance:   Left Eye Distance:   Bilateral Distance:    Right Eye Near:   Left Eye Near:    Bilateral Near:     Physical Exam Vitals and nursing note reviewed.  Cardiovascular:     Rate and Rhythm: Normal rate and regular rhythm.     Heart sounds: Normal heart sounds. No murmur heard. Pulmonary:     Effort: Pulmonary effort is normal. No respiratory distress.     Breath sounds: No wheezing or rhonchi.  Genitourinary:    Comments: Chaperon: Matt  No penile swelling or lesion or tenderness. Urethral orifice mildly irritated     UC Treatments / Results  Labs (all labs ordered are listed, but only abnormal results are displayed) Labs Reviewed  POCT URINALYSIS DIP (MANUAL ENTRY) - Abnormal; Notable for the following components:      Result Value   Glucose, UA >=1,000 (*)    Protein Ur, POC =100 (*)    All other components within normal limits  POCT FASTING CBG [facility] MANUAL ENTRY - Abnormal; Notable for the following components:   POCT Glucose ([facility]) 491 (*)    All other components within normal limits  URINE CULTURE  RPR  HIV ANTIBODY (ROUTINE TESTING W REFLEX)  HEMOGLOBIN A1C  COMPREHENSIVE METABOLIC PANEL  CYTOLOGY, (ORAL, ANAL, URETHRAL) ANCILLARY ONLY    EKG   Radiology No results found.  Procedures Procedures (including critical care time)  Medications Ordered in UC Medications - No data to display  Initial Impression / Assessment and Plan / UC Course  I have reviewed the triage vital signs and the nursing notes.  Pertinent labs & imaging results that were available during my care of the patient were reviewed by me and considered in my medical decision making (see chart for details).  Clinical Course as of  06/15/23 1126  Sun Jun 15, 2023  1033 BP(!): 150/102 [KE]    Clinical Course User Index [KE] Doreene Eland, MD    New onset DM2: CBG > 400 ED referral discussed and he declined. He is well appearing Hydration discussed Start Metformin 500 mg BID Information provided by CMA regarding establishing care with [facility] He will contact their office on Monday for F/U appointment  HTN:  Repeat BP 144/94 Start Lisinopril 5 mg daily F/U with PCP for monitoring Patient is aware  Increase urine frequency with penile irritation: No signs of infection ?? Candida infection in the settings of polyuria Empirical treatment with Diflucan discussed He agreed with the plan.   Final Clinical Impressions(s) / UC Diagnoses   Final diagnoses:  Glycosuria  Hyperglycemia  Diabetes mellitus, new onset [facility])     Discharge Instructions      It was nice seeing you. Your urine is negative for infection. However, you have a lot of glucose in your urine which could suggest diabetes. I have done some diabetes test and will call you with results tomorrow. I will likely start you on Metformin and blood pressure medication. I will contact you with all results soon.     ED Prescriptions     Medication Sig Dispense Auth. Provider   fluconazole (DIFLUCAN) 150 MG tablet Take 1 tablet (150 mg total) by mouth daily. 1 tablet Janit Pagan T, MD   lisinopril (ZESTRIL) 5 MG tablet Take 1 tablet (5 mg total) by mouth daily. 30 tablet Janit Pagan T, MD   metFORMIN (GLUCOPHAGE) 500 MG tablet Take 1 tablet (500 mg total) by mouth 2 (two) times daily with a meal. 60 tablet Doreene Eland, MD      PDMP not reviewed this encounter.   Doreene Eland, MD 06/15/23 1126

## 2023-06-16 LAB — URINE CULTURE: Culture: <10000 — AB

## 2023-06-16 LAB — CYTOLOGY, (ORAL, ANAL, URETHRAL) ANCILLARY ONLY
Chlamydia: NEGATIVE
Comment: NEGATIVE
Comment: NEGATIVE
Comment: NORMAL
Neisseria Gonorrhea: NEGATIVE
Trichomonas: NEGATIVE

## 2023-06-16 MED ORDER — BLOOD GLUCOSE MONITORING SUPPL DEVI: 1.0000 | Freq: Three times a day (TID) | 0 refills | Status: DC

## 2023-06-16 MED ORDER — LANCET DEVICE MISC: 1.0000 | Freq: Three times a day (TID) | 0 refills | Status: AC

## 2023-06-16 MED ORDER — LANCETS MISC. MISC: 1.0000 | Freq: Three times a day (TID) | 0 refills | Status: AC

## 2023-06-16 MED ORDER — BLOOD GLUCOSE TEST VI STRP: 1.0000 | ORAL_STRIP | Freq: Three times a day (TID) | 0 refills | Status: AC

## 2023-06-16 NOTE — Addendum Note (Signed)
Addended by: Janit Pagan T on: 06/16/2023 03:21 PM   Modules accepted: Orders

## 2023-06-16 NOTE — Telephone Encounter (Signed)
Neg UTI and STD screening discussed with him. He started his Metformin and Lisinopril. Completed Diflucan. He requested Glucometer prescription - sent to [facility] He has PCP/Endo appointment scheduled for Sept. He feels well otherwise.

## 2023-07-07 ENCOUNTER — Other Ambulatory Visit (INDEPENDENT_AMBULATORY_CARE_PROVIDER_SITE_OTHER): Payer: Self-pay | Admitting: Family Medicine

## 2023-08-08 ENCOUNTER — Ambulatory Visit: Payer: Medicaid Other | Admitting: Nurse Practitioner

## 2023-08-14 ENCOUNTER — Ambulatory Visit (HOSPITAL_BASED_OUTPATIENT_CLINIC_OR_DEPARTMENT_OTHER): Payer: Medicaid Other | Admitting: Family Medicine

## 2023-08-14 ENCOUNTER — Encounter (HOSPITAL_BASED_OUTPATIENT_CLINIC_OR_DEPARTMENT_OTHER): Payer: Self-pay | Admitting: Family Medicine

## 2023-08-14 VITALS — BP 128/90 | HR 85 | Ht 67.0 in | Wt 262.5 lb

## 2023-08-14 DIAGNOSIS — E1165 Type 2 diabetes mellitus with hyperglycemia: Secondary | ICD-10-CM | POA: Diagnosis not present

## 2023-08-14 DIAGNOSIS — R030 Elevated blood-pressure reading, without diagnosis of hypertension: Secondary | ICD-10-CM

## 2023-08-14 MED ORDER — ONDANSETRON HCL 4 MG PO TABS: 4.0000 mg | ORAL_TABLET | Freq: Three times a day (TID) | ORAL | 0 refills | Status: DC | PRN

## 2023-08-14 MED ORDER — TIRZEPATIDE 2.5 MG/0.5ML ~~LOC~~ SOAJ
2.5000 mg | SUBCUTANEOUS | 0 refills | Status: DC
Start: 2023-08-14 — End: 2023-08-20

## 2023-08-14 NOTE — Patient Instructions (Addendum)
Mounjaro:  Prior to your first injection, please increase your water and fiber intake (benefiber, metamucil, psyllium capsules) and use a stool softener (Colace). Miralax can also help with 1 capful per day. Stay hydrated.    CONSTIPATION: Dietary changes (more leafy greens, vegetables and fruits; less processed foods) Drink 64 oz of water a day. Ensure regular exercise.    Fiber supplementation (Benefiber, FiberCon, Metamucil, or Psyllium). Start slow and increase gradually to full dose.  Add a probiotic (such as Florastor) daily. Over-the-counter stool softeners such as: Docusate or Colace  If you are not able to have regular BM's with the above regimen, you may add miralax 1 tablespoon daily.  Increase or decrease amount/frequency as needed to ensure 1 soft BM/day.   Natural approach for constipation:  "Power Pudding" is a natural mixture that may help your constipation.  To make blend 1 cup applesauce, 1 cup wheat bran, and 3/4 cup prune juice, refrigerate and then take 1 tablespoon daily with a large glass of water as needed.    BOWEL REGIMEN FOR CONSTIPATION  Begin Miralax 1 & 1/2 capfuls three times daily (for example: 8am, 12pm, and 4pm) for two days. Decrease to 1 capful daily after this. Begin Senekot S (2 tablets twice daily) starting on day 2 of using Miralax. Stools should reach consistency of toothpaste. Once this has been achieved, use the Senakot or Colace as needed and begin Colace 100mg  twice daily. Maintenance regimen is using Miralax 1 capful daily and Colace 100mg  twice daily.   2. Increase liquids to at least 6-8 cups of water per day.   3. Increase dietary fiber including raw fruits, vegetables, high fiber grains and breads.   4. Decrease intake of foods that may cause constipation such as dairy (milk, cheeses), processed meats, and white bread.   5. Exercise daily. This can include walking for at least 30 minutes daily.   6. Reserve enough time for bowel  movements so you are not rushed. Do no ignore the urge to have a bowel movement.

## 2023-08-14 NOTE — Progress Notes (Signed)
New Patient Office Visit  Subjective:   Alexander Mccann 1996/05/24 08/14/2023  Chief Complaint  Patient presents with   New Patient (Initial Visit)    Pt is here today to get established with the practice.      HPI: Alexander Mccann presents today to establish care at [facility] at [facility]. Introduced to Publishing rights manager role and practice setting.  All questions answered.   Last PCP: None Last annual physical: n/a  Concerns: See below   DIABETES MELLITUS: Patient was started on metformin in July 2024 after UC visit in which his urine glucose was over 1000 and his blood glucose was over 400. His A1C was 11.1.  Reports family history with mom and dad both being type II diabetic.  He states he began to have constipation and was prescribed medication by UC which was not covered for constipation so he stopped the Metformin. He has changed his diet since diagnosis patient has been checking his blood glucose with a at home glucometer in the morning and evenings and has brought a log for PCP to review.  Average glucose ranges from approximately 180 up to 330.  Since July, his range has been getting more controlled with less spikes of glucose into the 300s.  He states he has made significant dietary changes, cut out sodas and simple sugars and is exercising regularly.  For his constipation he states he does have a bowel movement now every day and has been increasing his fiber and water intake.   Lab Results  Component Value Date   HGBA1C 11.1 (H) 06/15/2023    Wt Readings from Last 3 Encounters:  08/14/23 262 lb 8 oz (119.1 kg)  06/15/23 259 lb 6.4 oz (117.7 kg)  08/09/19 255 lb (115.7 kg)    HYPERTENSION: Alexander Mccann presents for the medical management of hypertension.  Patient's current hypertension medication regimen is: Diet and exercise Patient is not currently taking prescribed medications for HTN.  Patient is not regularly keeping  a check on BP at home.  Adhering to low sodium diet: Yes Exercising Regularly: Yes Denies headache, dizziness, CP, SHOB, vision changes.   BP Readings from Last 3 Encounters:  08/14/23 (!) 128/90  06/15/23 (!) 144/94  05/31/23 (!) 142/93    The following portions of the patient's history were reviewed and updated as appropriate: past medical history, past surgical history, family history, social history, allergies, medications, and problem list.   Patient Active Problem List   Diagnosis Date Noted   VIRAL INFECTION 01/19/2008   ACUTE PHARYNGITIS 01/19/2008   MORBID OBESITY 01/08/2008   INGROWN TOENAIL 01/08/2008   TINEA VERSICOLOR 08/04/2007   HYPOGONADISM 08/04/2007   BRONCHITIS NOS 08/04/2007   Past Medical History:  Diagnosis Date   Diabetes mellitus without complication (HCC)    Obesity    History reviewed. No pertinent surgical history. Family History  Problem Relation Age of Onset   Diabetes Mother    Hypertension Mother    Social History   Socioeconomic History   Marital status: Married    Spouse name: Not on file   Number of children: Not on file   Years of education: Not on file   Highest education level: GED or equivalent  Occupational History   Not on file  Tobacco Use   Smoking status: Every Day    Current packs/day: 1.00    Types: Cigarettes   Smokeless tobacco: Never  Vaping Use   Vaping status: Never Used  Substance and Sexual Activity   Alcohol use: Not Currently    Comment: occasionally drinking   Drug use: No   Sexual activity: Yes  Other Topics Concern   Not on file  Social History Narrative   Not on file   Social Determinants of Health   Financial Resource Strain: Medium Risk (08/13/2023)   Overall Financial Resource Strain (CARDIA)    Difficulty of Paying Living Expenses: Somewhat hard  Food Insecurity: Food Insecurity Present (08/13/2023)   Hunger Vital Sign    Worried About Running Out of Food in the Last Year: Sometimes true     Ran Out of Food in the Last Year: Sometimes true  Transportation Needs: No Transportation Needs (08/13/2023)   PRAPARE - Administrator, Civil Service (Medical): No    Lack of Transportation (Non-Medical): No  Physical Activity: Insufficiently Active (08/13/2023)   Exercise Vital Sign    Days of Exercise per Week: 2 days    Minutes of Exercise per Session: 30 min  Stress: No Stress Concern Present (08/13/2023)   [employer] of Occupational Health - Occupational Stress Questionnaire    Feeling of Stress : Not at all  Social Connections: Moderately Integrated (08/14/2023)   Social Connection and Isolation Panel [NHANES]    Frequency of Communication with Friends and Family: Twice a week    Frequency of Social Gatherings with Friends and Family: Twice a week    Attends Religious Services: Never    Database administrator or Organizations: Yes    Attends Banker Meetings: Never    Marital Status: Married  Catering manager Violence: Not At Risk (08/14/2023)   Humiliation, Afraid, Rape, and Kick questionnaire    Fear of Current or Ex-Partner: No    Emotionally Abused: No    Physically Abused: No    Sexually Abused: No   Outpatient Medications Prior to Visit  Medication Sig Dispense Refill   Blood Glucose Monitoring Suppl DEVI 1 each by Does not apply route in the morning, at noon, and at bedtime. May substitute to any manufacturer covered by patient's insurance. (Patient not taking: Reported on 08/14/2023) 1 each 0   clotrimazole (LOTRIMIN) 1 % cream Apply to affected area 2 times daily 40 g 0   doxycycline (VIBRAMYCIN) 100 MG capsule Take 1 capsule (100 mg total) by mouth 2 (two) times daily. 14 capsule 0   fluconazole (DIFLUCAN) 150 MG tablet Take 1 tablet (150 mg total) by mouth daily. 1 tablet 0   lisinopril (ZESTRIL) 5 MG tablet Take 1 tablet (5 mg total) by mouth daily. 30 tablet 0   loratadine (CLARITIN) 10 MG tablet Take 1 tablet (10 mg total) by mouth  daily. (Patient not taking: Reported on 12/05/2019) 30 tablet 0   metFORMIN (GLUCOPHAGE) 500 MG tablet Take 1 tablet (500 mg total) by mouth 2 (two) times daily with a meal. (Patient not taking: Reported on 08/14/2023) 60 tablet 0   naproxen sodium (ANAPROX DS) 550 MG tablet Take 1 tablet (550 mg total) by mouth 2 (two) times daily with a meal. 30 tablet 0   No facility-administered medications prior to visit.   No Known Allergies  ROS: A complete ROS was performed with pertinent positives/negatives noted in the HPI. The remainder of the ROS are negative.   Objective:   Today's Vitals   08/14/23 0940 08/14/23 1027  BP: (!) 146/97 (!) 128/90  Pulse: 85   SpO2: 99%   Weight: 262 lb 8 oz (119.1  kg)   Height: 5\' 7"  (1.702 m)     GENERAL: Well-appearing, in NAD. Well nourished.  SKIN: Pink, warm and dry. No rash, lesion, ulceration, or ecchymoses.  Head: Normocephalic. NECK: Trachea midline. Full ROM w/o pain or tenderness.  EYES: Conjunctiva clear without exudates. EOMI, PERRL, no drainage present.  RESPIRATORY: Chest wall symmetrical. Respirations even and non-labored. EXTREMITIES: Without clubbing, cyanosis, or edema.  NEUROLOGIC: No motor or sensory deficits. Steady, even gait. C2-C12 intact.  PSYCH/MENTAL STATUS: Alert, oriented x 3. Cooperative, appropriate mood and affect.      Assessment & Plan:  1. Type 2 diabetes mellitus with hyperglycemia, without long-term current use of insulin (HCC) PCP had in-depth education with handouts provided and review of his A1c, basics of diabetes and nutrition, monitoring glucose with patient here in the office.  He and PCP elected to try Mounjaro 2.5 mg for 4 weeks.  He denied any family history of medullary thyroid cancer or MEN 2.  We discussed possible GI side effects such as diarrhea, constipation or nausea.  Zofran was sent in to mitigate nausea.  Recommend starting Colace, increasing fiber intake and using MiraLAX or fiber supplements to  regulate bowel movements.  Several methods to minimize constipation have been reviewed with patient and given in his AVS.  He elected to proceed with use of Mounjaro, risks and side effects of medication were reviewed with patient and he verbalized understanding.  Demo pen also demonstrated with patient on how to use.  Referral to nutrition was also made for patient to establish with them and learn how to eat well for diabetes management.  Will obtain lab work today as well.  He will return in 6 weeks for repeat A1c, urine albumin and foot exam.  - tirzepatide (MOUNJARO) 2.5 MG/0.5ML Pen; Inject 2.5 mg into the skin once a week.  Dispense: 2 mL; Refill: 0 - Referral to Nutrition and Diabetes Services - Lipid panel - CBC with Differential/Platelet - Comprehensive metabolic panel - Hemoglobin A1c - TSH  2. Elevated BP without diagnosis of hypertension Blood pressure improved by the end of his visit.  Denies history of hypertension.  Will monitor with next visit and start medication if needed.  Discussed benefits of diet and exercise with reduction in risk of blood pressure, coronary artery disease and improvement with diabetes.  He verbalized understanding.   Patient to reach out to office if new, worrisome, or unresolved symptoms arise or if no improvement in patient's condition. Patient verbalized understanding and is agreeable to treatment plan. All questions answered to patient's satisfaction.    Return in about 6 weeks (around 09/25/2023) for ANNUAL PHYSICAL, DIABETES CHECK UP.   A total of 65 minutes were spent on this encounter today. When total time is documented, this includes both the face-to-face and non-face-to-face time personally spent before, during and after the visit on the date of the encounter.   Of note, portions of this note may have been created with voice recognition software Physicist, medical). While this note has been edited for accuracy, occasional wrong-word or  'sound-a-like' substitutions may have occurred due to the inherent limitations of voice recognition software.  Yolanda Manges, FNP

## 2023-08-15 LAB — LIPID PANEL
Chol/HDL Ratio: 5.1 ratio — ABNORMAL HIGH (ref 0.0–5.0)
Cholesterol, Total: 152 mg/dL (ref 100–199)
HDL: 30 mg/dL — ABNORMAL LOW (ref >39)
LDL Chol Calc (NIH): 98 mg/dL (ref 0–99)
Triglycerides: 133 mg/dL (ref 0–149)
VLDL Cholesterol Cal: 24 mg/dL (ref 5–40)

## 2023-08-15 LAB — COMPREHENSIVE METABOLIC PANEL
ALT: 56 IU/L — ABNORMAL HIGH (ref 0–44)
AST: 35 IU/L (ref 0–40)
Albumin: 4.4 g/dL (ref 4.3–5.2)
Alkaline Phosphatase: 84 IU/L (ref 44–121)
BUN/Creatinine Ratio: 13 (ref 9–20)
BUN: 11 mg/dL (ref 6–20)
Bilirubin Total: 0.4 mg/dL (ref 0.0–1.2)
CO2: 25 mmol/L (ref 20–29)
Calcium: 10 mg/dL (ref 8.7–10.2)
Chloride: 105 mmol/L (ref 96–106)
Creatinine, Ser: 0.86 mg/dL (ref 0.76–1.27)
Globulin, Total: 2.5 g/dL (ref 1.5–4.5)
Glucose: 147 mg/dL — ABNORMAL HIGH (ref 70–99)
Potassium: 4.9 mmol/L (ref 3.5–5.2)
Sodium: 142 mmol/L (ref 134–144)
Total Protein: 6.9 g/dL (ref 6.0–8.5)
eGFR: 120 mL/min/{1.73_m2} (ref >59)

## 2023-08-15 LAB — TSH: TSH: 1.01 u[IU]/mL (ref 0.450–4.500)

## 2023-08-15 LAB — CBC WITH DIFFERENTIAL/PLATELET
Basophils Absolute: 0.1 10*3/uL (ref 0.0–0.2)
Basos: 1 %
EOS (ABSOLUTE): 0.3 10*3/uL (ref 0.0–0.4)
Eos: 5 %
Hematocrit: 42.3 % (ref 37.5–51.0)
Hemoglobin: 14.2 g/dL (ref 13.0–17.7)
Immature Grans (Abs): 0 10*3/uL (ref 0.0–0.1)
Immature Granulocytes: 0 %
Lymphocytes Absolute: 2.2 10*3/uL (ref 0.7–3.1)
Lymphs: 41 %
MCH: 29.1 pg (ref 26.6–33.0)
MCHC: 33.6 g/dL (ref 31.5–35.7)
MCV: 87 fL (ref 79–97)
Monocytes Absolute: 0.5 10*3/uL (ref 0.1–0.9)
Monocytes: 9 %
Neutrophils Absolute: 2.3 10*3/uL (ref 1.4–7.0)
Neutrophils: 44 %
Platelets: 195 10*3/uL (ref 150–450)
RBC: 4.88 x10E6/uL (ref 4.14–5.80)
RDW: 13.2 % (ref 11.6–15.4)
WBC: 5.3 10*3/uL (ref 3.4–10.8)

## 2023-08-15 LAB — HEMOGLOBIN A1C
Est. average glucose Bld gHb Est-mCnc: 183 mg/dL
Hgb A1c MFr Bld: 8 % — ABNORMAL HIGH (ref 4.8–5.6)

## 2023-08-19 ENCOUNTER — Encounter (HOSPITAL_BASED_OUTPATIENT_CLINIC_OR_DEPARTMENT_OTHER): Payer: Self-pay | Admitting: Family Medicine

## 2023-08-19 ENCOUNTER — Telehealth (HOSPITAL_BASED_OUTPATIENT_CLINIC_OR_DEPARTMENT_OTHER): Payer: Self-pay | Admitting: Family Medicine

## 2023-08-19 NOTE — Telephone Encounter (Signed)
Pt is returning call.

## 2023-08-20 ENCOUNTER — Other Ambulatory Visit (HOSPITAL_BASED_OUTPATIENT_CLINIC_OR_DEPARTMENT_OTHER): Payer: Self-pay | Admitting: Family Medicine

## 2023-08-20 MED ORDER — OZEMPIC (0.25 OR 0.5 MG/DOSE) 2 MG/3ML ~~LOC~~ SOPN
PEN_INJECTOR | SUBCUTANEOUS | 2 refills | Status: DC
Start: 2023-08-20 — End: 2023-12-26

## 2023-08-20 NOTE — Progress Notes (Signed)
Discussed that Alexander Mccann is not covered due to not trialing Ozempic, Trulicity or Victoza first.  Patient would like to try Ozempic.  Ozempic sent in.  Side effects and safe use of this medication reviewed with patient over the phone.

## 2023-09-11 ENCOUNTER — Ambulatory Visit: Payer: Medicaid Other | Admitting: Dietician

## 2023-09-19 ENCOUNTER — Ambulatory Visit: Payer: Medicaid Other | Admitting: Family Medicine

## 2023-09-25 ENCOUNTER — Encounter (HOSPITAL_BASED_OUTPATIENT_CLINIC_OR_DEPARTMENT_OTHER): Payer: Self-pay | Admitting: Family Medicine

## 2023-09-25 ENCOUNTER — Ambulatory Visit (HOSPITAL_BASED_OUTPATIENT_CLINIC_OR_DEPARTMENT_OTHER): Payer: Medicaid Other | Admitting: Family Medicine

## 2023-09-25 VITALS — BP 128/88 | HR 92 | Ht 67.0 in | Wt 261.5 lb

## 2023-09-25 DIAGNOSIS — Z202 Contact with and (suspected) exposure to infections with a predominantly sexual mode of transmission: Secondary | ICD-10-CM

## 2023-09-25 DIAGNOSIS — S46912A Strain of unspecified muscle, fascia and tendon at shoulder and upper arm level, left arm, initial encounter: Secondary | ICD-10-CM

## 2023-09-25 DIAGNOSIS — Z0000 Encounter for general adult medical examination without abnormal findings: Secondary | ICD-10-CM

## 2023-09-25 DIAGNOSIS — E1165 Type 2 diabetes mellitus with hyperglycemia: Secondary | ICD-10-CM

## 2023-09-25 LAB — POCT GLUCOSE (DEVICE FOR HOME USE): POC Glucose: 315 mg/dL — AB (ref 70–99)

## 2023-09-25 LAB — POCT UA - MICROALBUMIN
Creatinine, POC: 200 mg/dL
Microalbumin Ur, POC: 80 mg/L

## 2023-09-25 NOTE — Progress Notes (Signed)
Subjective:   Alexander Mccann Jan 31, 1996 09/25/2023  CC: Chief Complaint  Patient presents with   Annual Exam    Patient is here today for his physical. Has been having pain in his upper left torso thinking he might have pulled a muscle and is requesting imaging to be done.    HPI: Alexander Mccann is a 27 y.o. male who presents for a routine health maintenance exam.  Labs collected at time of visit.   Upper Extremity Concern:  Pain ongoing for several months to left upper arm and shoulder. He states pain worsens with lifting and colder temperatures. Denies injury or fall. Reports "stiffness" and "pulling sensation" with certain movements. Denies clicking, popping, weakness or paresthesias to upper extremity.    DIABETES MELLITUS: [name] presents for the medical management of diabetes.  Current diabetes medication regimen: Not taking Ozempic. Patient did not start medication when prescribed as he is afraid of "getting addicted to medication and relying on it". Would prefer to manage his DM with diet and exercise. Patient states he stopped checking his glucose as well.  Patient is  adhering to a diabetic diet. He is doing low carb, and higher protein.  Patient is  exercising regularly. He is walking regularly Patient is not checking BS regularly.  Patient is  checking their feet regularly.  Denies polydipsia, polyphagia, polyuria, open wounds or ulcers on feet.  Lab Results  Component Value Date   HGBA1C 8.0 (H) 08/14/2023    09/25/2023 Lab Results  Component Value Date   MICROALBUR 80 09/25/2023    Wt Readings from Last 3 Encounters:  09/25/23 261 lb 8 oz (118.6 kg)  08/14/23 262 lb 8 oz (119.1 kg)  06/15/23 259 lb 6.4 oz (117.7 kg)     HEALTH SCREENINGS: - Vision Screening: not applicable - Dental Visits: up to date - Testicular Exam: Declined - STD Screening: Declined - PSA (50+): Not applicable  No results found for: "PSA1", "PSA"   - Colonoscopy  (45+): Done elsewhere  Discussed with patient purpose of the colonoscopy is to detect colon cancer at curable precancerous or early stages  - AAA Screening: Not applicable  Men age 2-75 who have ever smoked - Lung Cancer screening with low-dose CT: Not applicable-  Adults age 29-80 who are current cigarette smokers or quit within the last 15 years. Must have 20 pack year history.   Depression and Anxiety Screen done today and results listed below:     09/25/2023    9:36 AM 08/14/2023    9:48 AM  Depression screen PHQ 2/9  Decreased Interest 0 0  Down, Depressed, Hopeless  0  PHQ - 2 Score 0 0  Altered sleeping 0 0  Tired, decreased energy 0 1  Change in appetite 0 1  Feeling bad or failure about yourself  0 0  Trouble concentrating 0 0  Moving slowly or fidgety/restless 0 0  Suicidal thoughts 0 0  PHQ-9 Score 0 2  Difficult doing work/chores Not difficult at all Not difficult at all      09/25/2023    9:36 AM 08/14/2023    9:48 AM  GAD 7 : Generalized Anxiety Score  Nervous, Anxious, on Edge 0 0  Control/stop worrying 0 0  Worry too much - different things 0 0  Trouble relaxing 0 0  Restless 0 0  Easily annoyed or irritable 0 0  Afraid - awful might happen 0 1  Total GAD 7 Score 0 1  Anxiety Difficulty  Not difficult at all Not difficult at all    IMMUNIZATIONS:  - Tdap: Tetanus vaccination status reviewed: Declined. - Influenza: Refused - Pneumovax: Not applicable - Prevnar: Not applicable - Zostavax vaccine (50+): Not applicable   Past medical history, surgical history, medications, allergies, family history and social history reviewed with patient today and changes made to appropriate areas of the chart.   Past Medical History:  Diagnosis Date   Diabetes mellitus without complication (HCC)    Obesity     History reviewed. No pertinent surgical history.  Current Outpatient Medications on File Prior to Visit  Medication Sig   ondansetron (ZOFRAN) 4 MG  tablet Take 1 tablet (4 mg total) by mouth every 8 (eight) hours as needed for nausea or vomiting.   Semaglutide,0.25 or 0.5MG /DOS, (OZEMPIC, 0.25 OR 0.5 MG/DOSE,) 2 MG/3ML SOPN Inject 0.25 mg into the skin once a week for 4 weeks. (Patient not taking: Reported on 09/25/2023)   No current facility-administered medications on file prior to visit.    No Known Allergies   Social History   Socioeconomic History   Marital status: Married    Spouse name: Not on file   Number of children: Not on file   Years of education: Not on file   Highest education level: GED or equivalent  Occupational History   Not on file  Tobacco Use   Smoking status: Every Day    Current packs/day: 1.00    Types: Cigarettes   Smokeless tobacco: Never  Vaping Use   Vaping status: Never Used  Substance and Sexual Activity   Alcohol use: Not Currently    Comment: occasionally drinking   Drug use: No   Sexual activity: Yes  Other Topics Concern   Not on file  Social History Narrative   Not on file   Social Determinants of Health   Financial Resource Strain: Medium Risk (08/13/2023)   Overall Financial Resource Strain (CARDIA)    Difficulty of Paying Living Expenses: Somewhat hard  Food Insecurity: Food Insecurity Present (08/13/2023)   Hunger Vital Sign    Worried About Running Out of Food in the Last Year: Sometimes true    Ran Out of Food in the Last Year: Sometimes true  Transportation Needs: No Transportation Needs (08/13/2023)   PRAPARE - Administrator, Civil Service (Medical): No    Lack of Transportation (Non-Medical): No  Physical Activity: Insufficiently Active (08/13/2023)   Exercise Vital Sign    Days of Exercise per Week: 2 days    Minutes of Exercise per Session: 30 min  Stress: No Stress Concern Present (08/13/2023)   [employer] of Occupational Health - Occupational Stress Questionnaire    Feeling of Stress : Not at all  Social Connections: Moderately Integrated  (08/14/2023)   Social Connection and Isolation Panel [NHANES]    Frequency of Communication with Friends and Family: Twice a week    Frequency of Social Gatherings with Friends and Family: Twice a week    Attends Religious Services: Never    Database administrator or Organizations: Yes    Attends Banker Meetings: Never    Marital Status: Married  Catering manager Violence: Not At Risk (08/14/2023)   Humiliation, Afraid, Rape, and Kick questionnaire    Fear of Current or Ex-Partner: No    Emotionally Abused: No    Physically Abused: No    Sexually Abused: No   Social History   Tobacco Use  Smoking Status Every Day  Current packs/day: 1.00   Types: Cigarettes  Smokeless Tobacco Never   Social History   Substance and Sexual Activity  Alcohol Use Not Currently   Comment: occasionally drinking     Family History  Problem Relation Age of Onset   Diabetes Mother    Hypertension Mother      ROS: Denies fever, fatigue, unexplained weight loss/gain, CP, SHOB, and palpatitations. Denies neurological deficits, gastrointestinal and/or genitourinary complaints, and skin changes.   Objective:   Today's Vitals   09/25/23 0930 09/25/23 0940  BP: (!) 151/94 128/88  Pulse: 92   SpO2: 99%   Weight: 261 lb 8 oz (118.6 kg)   Height: 5\' 7"  (1.702 m)     GENERAL APPEARANCE: Well-appearing, in NAD. Well nourished.  SKIN: Pink, warm and dry. Turgor normal. No rash, lesion, ulceration, or ecchymoses. Hair evenly distributed.  HEENT: HEAD: Normocephalic.  EYES: PERRLA. EOMI. Lids intact w/o defect. Sclera white, Conjunctiva pink w/o exudate.  EARS: External ear w/o redness, swelling, masses or lesions. EAC clear. TM's intact, translucent w/o bulging, appropriate landmarks visualized. Appropriate acuity to conversational tones.  NOSE: Septum midline w/o deformity. Nares patent, mucosa pink and non-inflamed w/o drainage. No sinus tenderness.  THROAT: Uvula midline. Oropharynx  clear. Tonsils non-inflamed w/o exudate. Oral mucosa pink and moist.  NECK: Supple, Trachea midline. Full ROM w/o pain or tenderness. No lymphadenopathy. Thyroid non-tender w/o enlargement or palpable masses.  RESPIRATORY: Chest wall symmetrical w/o masses. Respirations even and non-labored. Breath sounds clear to auscultation bilaterally. No wheezes, rales, rhonchi, or crackles. CARDIAC: S1, S2 present, regular rate and rhythm. No gallops, murmurs, rubs, or clicks. PMI w/o lifts, heaves, or thrills. No carotid bruits. Capillary refill <2 seconds. Peripheral pulses 2+ bilaterally. GI: Abdomen soft w/o distention. Normoactive bowel sounds. No palpable masses or tenderness. No guarding or rebound tenderness. Liver and spleen w/o tenderness or enlargement. No CVA tenderness.  GU: Pt deferred exam. MSK: Muscle tone and strength appropriate for age, w/o atrophy or abnormal movement. EXTREMITIES: Active ROM intact,  crepitus, or contracture. No obvious joint deformities or effusions. No clubbing, edema, or cyanosis. Mild pain with abduction of LUE. Full ROM present, no paresthesias with manipulation. +3 radial pulses bilaterally.  NEUROLOGIC: CN's II-XII intact. Motor strength symmetrical with no obvious weakness. No sensory deficits. DTR 2+ symmetric bilaterally. Steady, even gait.  PSYCH/MENTAL STATUS: Alert, oriented x 3. Cooperative, appropriate mood and affect.   Diabetic Foot Exam - Simple   Simple Foot Form Diabetic Foot exam was performed with the following findings: Yes 09/25/2023  9:40 AM  Visual Inspection No deformities, no ulcerations, no other skin breakdown bilaterally: Yes Sensation Testing Intact to touch and monofilament testing bilaterally: Yes Pulse Check Posterior Tibialis and Dorsalis pulse intact bilaterally: Yes Comments      Results for orders placed or performed in visit on 09/25/23  POCT Glucose (Device for Home Use)  Result Value Ref Range   Glucose Fasting, POC      POC Glucose 315 (A) 70 - 99 mg/dl  POCT UA - Microalbumin  Result Value Ref Range   Microalbumin Ur, POC 80 mg/L   Creatinine, POC 200 mg/dL   Albumin/Creatinine Ratio, Urine, POC 30-300     Assessment & Plan:  1. Annual physical exam Discussed preventative screenings with patient and reviewed his lab results in depth. Reviewed needed lifestyle changed for prevention and he verbalized understanding.   2. Type 2 diabetes mellitus with hyperglycemia, without long-term current use of insulin (HCC) Patient noncompliant  with PCP recommended medications. PCP provided multiple recommendations or alternatives to use and patient declined. Discussed importance of medication, diet and exercise and monitoring glucose regularly. He would like to try diet and exercise to manage hyperglycemia and DM. Recommend patient to complete eye exam. Foot and urine albumin exam completed in office and results discussed. Pt is agreeable to nutrition consult in November for diabetes education. He knows he can call office to start medication at anytime if he changes his mind.   - POCT Glucose (Device for Home Use) - POCT UA - Microalbumin - Comprehensive metabolic panel; Future - Hemoglobin A1c; Future  3. Encounter for assessment of sexually transmitted disease exposure Asymptomatic. Urine screening provided and will be notified of results.  - Ct, Ng, Mycoplasmas NAA, Urine  4. Shoulder strain, left, initial encounter Likely strain of rotator cuff and recommen OTC NSAIDs as needed, ice/heat therapy and stretches provided. If no improvement in 2-4 weeks, reach out to PCP.   Orders Placed This Encounter  Procedures   Ct, Ng, Mycoplasmas NAA, Urine   Comprehensive metabolic panel    Standing Status:   Future    Standing Expiration Date:   09/24/2024   Hemoglobin A1c    Standing Status:   Future    Standing Expiration Date:   09/24/2024   POCT Glucose (Device for Home Use)   POCT UA - Microalbumin     PATIENT COUNSELING: - Encouraged to adjust caloric intake to maintain or achieve ideal body weight, to reduce intake of dietary saturated fat and total fat, to limit sodium intake by avoiding high sodium foods and not adding table salt, and to maintain adequate dietary potassium and calcium preferably from fresh fruits, vegetables, and low-fat dairy products.   - Advised to avoid cigarette smoking. - Discussed with the patient that most people either abstain from alcohol or drink within safe limits (<=14/week and <=4 drinks/occasion for males, <=7/weeks and <= 3 drinks/occasion for females) and that the risk for alcohol disorders and other health effects rises proportionally with the number of drinks per week and how often a drinker exceeds daily limits. - Discussed cessation/primary prevention of drug use and availability of treatment for abuse.   - Stressed the importance of regular exercise - Injury prevention: Discussed safety belts, safety helmets, smoke detector, smoking near bedding or upholstery.  - Dental health: Discussed importance of regular tooth brushing, flossing, and dental visits.  - Sexuality: Discussed sexually transmitted diseases, partner selection, use of condoms, avoidance of unintended pregnancy  and contraceptive alternatives.   NEXT PREVENTATIVE PHYSICAL DUE IN 1 YEAR.  Return in about 3 months (around 12/26/2023) for DIABETES CHECK UP (fasting labs prior) .  Patient to reach out to office if new, worrisome, or unresolved symptoms arise or if no improvement in patient's condition. Patient verbalized understanding and is agreeable to treatment plan. All questions answered to patient's satisfaction.   Of note, portions of this note may have been created with voice recognition software Physicist, medical). While this note has been edited for accuracy, occasional wrong-word or 'sound-a-like' substitutions may have occurred due to the inherent limitations of voice recognition  software.   Yolanda Manges, FNP

## 2023-09-29 ENCOUNTER — Telehealth (HOSPITAL_BASED_OUTPATIENT_CLINIC_OR_DEPARTMENT_OTHER): Payer: Self-pay | Admitting: Family Medicine

## 2023-09-29 ENCOUNTER — Other Ambulatory Visit (HOSPITAL_BASED_OUTPATIENT_CLINIC_OR_DEPARTMENT_OTHER): Payer: Self-pay | Admitting: Family Medicine

## 2023-09-29 LAB — CT, NG, MYCOPLASMAS NAA, URINE
Chlamydia trachomatis, NAA: NEGATIVE
Mycoplasma genitalium NAA: NEGATIVE
Mycoplasma hominis NAA: NEGATIVE
Neisseria gonorrhoeae, NAA: NEGATIVE
Ureaplasma spp NAA: POSITIVE — AB

## 2023-09-29 MED ORDER — DOXYCYCLINE HYCLATE 100 MG PO TABS: 100.0000 mg | ORAL_TABLET | Freq: Two times a day (BID) | ORAL | 0 refills | Status: DC

## 2023-09-29 NOTE — Telephone Encounter (Signed)
Patient calling to discuss Test results, labs

## 2023-09-29 NOTE — Telephone Encounter (Signed)
Pt is calling regarding urine testing. He would like more information on what that diagnosis is please call the patient

## 2023-09-29 NOTE — Progress Notes (Signed)
Hi Alexander Mccann,  Your STD testing is positive for a type of bacteria called Ureaplasma. This can be treated with antibiotics. We also recommend your partners are tested and treated as well. I have sent in Doxycycline an antibiotic for you to take twice daily for 7 days. You do not need to retest after completion of the antibiotics. If you have questions, please call the office.

## 2023-09-29 NOTE — Telephone Encounter (Signed)
Alexis, please advise on this.

## 2023-09-30 ENCOUNTER — Encounter (HOSPITAL_BASED_OUTPATIENT_CLINIC_OR_DEPARTMENT_OTHER): Payer: Self-pay | Admitting: Family Medicine

## 2023-10-16 ENCOUNTER — Ambulatory Visit: Payer: Medicaid Other | Admitting: Dietician

## 2023-11-10 ENCOUNTER — Encounter (HOSPITAL_BASED_OUTPATIENT_CLINIC_OR_DEPARTMENT_OTHER): Payer: Self-pay | Admitting: Family Medicine

## 2023-11-10 DIAGNOSIS — E1165 Type 2 diabetes mellitus with hyperglycemia: Secondary | ICD-10-CM

## 2023-11-25 ENCOUNTER — Ambulatory Visit (HOSPITAL_BASED_OUTPATIENT_CLINIC_OR_DEPARTMENT_OTHER)
Admission: EM | Admit: 2023-11-25 | Discharge: 2023-11-25 | Disposition: A | Discharge status: Home / Self Care | Payer: Medicaid Other

## 2023-11-25 ENCOUNTER — Encounter (HOSPITAL_BASED_OUTPATIENT_CLINIC_OR_DEPARTMENT_OTHER): Payer: Self-pay

## 2023-11-25 DIAGNOSIS — K130 Diseases of lips: Secondary | ICD-10-CM

## 2023-11-25 DIAGNOSIS — T7840XA Allergy, unspecified, initial encounter: Secondary | ICD-10-CM | POA: Diagnosis not present

## 2023-11-25 NOTE — ED Triage Notes (Signed)
 States today when using toothpaste, felt burning to upper and lower lips. No active sores. States no discomfort now. Patient is a Type @ Diabetic. Takes no meds. Reports attempts to control blood sugar with diet.

## 2023-11-25 NOTE — ED Provider Notes (Addendum)
 [name]    CSN: [id number] Arrival date & time: 11/25/23  9141      History   Chief Complaint Chief Complaint  Patient presents with   lip burning    HPI Alexander Mccann is a 27 y.o. male.   Patient presents to urgent care for evaluation of lip burning sensation.  He used a new toothpaste 2 to 3 days ago for the first time and felt some tingling to the inner upper lip at that time without any swelling, burning, or rash.  He then woke up this morning at 4:30 AM, brushed his teeth, then went back to sleep and woke up again at 8 AM.  When he woke up again, he developed burning sensation to his lips and perioral area to his mouth without lesions/rash.  Denies pain/burning to the inside of the mouth/oral mucosa, dental pain, fevers, chills, and rash.  He does not drink alcohol and denies recent intake of new foods or use of new lotions/personal hygiene products.  Denies throat/tongue swelling, shortness of breath, chest pain, and palpitations.  History of type 2 diabetes that is currently diet controlled.  He read online that this may happen with type 2 diabetes.  He applied Vaseline to the lips and states this helped a little bit but symptoms returned when Vaseline wore off.  Concerned for herpes after googling symptoms.  No recent known exposures or new sexual contacts. New tooth paste is called [employer].      Past Medical History:  Diagnosis Date   Diabetes mellitus without complication (HCC)    Obesity     Patient Active Problem List   Diagnosis Date Noted   Elevated BP without diagnosis of hypertension 08/14/2023   Type 2 diabetes mellitus with hyperglycemia, without long-term current use of insulin (HCC) 08/14/2023   VIRAL INFECTION 01/19/2008   ACUTE PHARYNGITIS 01/19/2008   MORBID OBESITY 01/08/2008   INGROWN TOENAIL 01/08/2008   TINEA VERSICOLOR 08/04/2007   HYPOGONADISM 08/04/2007   BRONCHITIS NOS 08/04/2007    History reviewed. No pertinent  surgical history.     Home Medications    Prior to Admission medications   Medication Sig Start Date End Date Taking? Authorizing Provider  doxycycline  (VIBRA -TABS) 100 MG tablet Take 1 tablet (100 mg total) by mouth 2 (two) times daily. 09/29/23   Caudle, Thersia Bitters, FNP  ondansetron  (ZOFRAN ) 4 MG tablet Take 1 tablet (4 mg total) by mouth every 8 (eight) hours as needed for nausea or vomiting. 08/14/23   Caudle, Thersia Bitters, FNP  Semaglutide ,0.25 or 0.5MG /DOS, (OZEMPIC , 0.25 OR 0.5 MG/DOSE,) 2 MG/3ML SOPN Inject 0.25 mg into the skin once a week for 4 weeks. Patient not taking: Reported on 09/25/2023 08/20/23   Knute Thersia Bitters, FNP    Family History Family History  Problem Relation Age of Onset   Diabetes Mother    Hypertension Mother     Social History Social History   Tobacco Use   Smoking status: Every Day    Current packs/day: 1.00    Types: Cigarettes   Smokeless tobacco: Never  Vaping Use   Vaping status: Never Used  Substance Use Topics   Alcohol use: Not Currently    Comment: occasionally drinking   Drug use: No     Allergies   Patient has no known allergies.   Review of Systems Review of Systems Per HPI  Physical Exam Triage Vital Signs ED Triage Vitals  Encounter Vitals Group     BP 11/25/23  0958 (!) 146/89     Systolic BP Percentile --      Diastolic BP Percentile --      Pulse Rate 11/25/23 0958 87     Resp 11/25/23 0958 20     Temp 11/25/23 0958 97.8 F (36.6 C)     Temp Source 11/25/23 0958 Oral     SpO2 11/25/23 0958 97 %     Weight 11/25/23 0959 250 lb (113.4 kg)     Height --      Head Circumference --      Peak Flow --      Pain Score 11/25/23 0959 0     Pain Loc --      Pain Education --      Exclude from Growth Chart --    No data found.  Updated Vital Signs BP (!) 146/89 (BP Location: Right Arm)   Pulse 87   Temp 97.8 F (36.6 C) (Oral)   Resp 20   Wt 250 lb (113.4 kg)   SpO2 97%   BMI 39.16 kg/m   Visual  Acuity Right Eye Distance:   Left Eye Distance:   Bilateral Distance:    Right Eye Near:   Left Eye Near:    Bilateral Near:     Physical Exam Vitals and nursing note reviewed.  Constitutional:      Appearance: He is not ill-appearing or toxic-appearing.  HENT:     Head: Normocephalic and atraumatic.     Right Ear: Hearing, tympanic membrane, ear canal and external ear normal.     Left Ear: Hearing, tympanic membrane, ear canal and external ear normal.     Nose: Nose normal.     Mouth/Throat:     Lips: Pink.     Mouth: Mucous membranes are moist. No injury or oral lesions.     Dentition: Normal dentition.     Tongue: No lesions.     Pharynx: Oropharynx is clear. Uvula midline. No pharyngeal swelling, oropharyngeal exudate, posterior oropharyngeal erythema, uvula swelling or postnasal drip.     Tonsils: No tonsillar exudate.     Comments: No rash, swelling, angioedema, or difficulty maintaining secretions.  Eyes:     General: Lids are normal. Vision grossly intact. Gaze aligned appropriately.     Extraocular Movements: Extraocular movements intact.     Conjunctiva/sclera: Conjunctivae normal.  Neck:     Trachea: Trachea and phonation normal.  Cardiovascular:     Rate and Rhythm: Normal rate and regular rhythm.     Heart sounds: Normal heart sounds, S1 normal and S2 normal.  Pulmonary:     Effort: Pulmonary effort is normal. No respiratory distress.     Breath sounds: Normal breath sounds and air entry.  Musculoskeletal:     Cervical back: Neck supple.  Lymphadenopathy:     Cervical: No cervical adenopathy.  Skin:    General: Skin is warm and dry.     Capillary Refill: Capillary refill takes less than 2 seconds.     Findings: No rash.  Neurological:     General: No focal deficit present.     Mental Status: He is alert and oriented to person, place, and time. Mental status is at baseline.     Cranial Nerves: No dysarthria or facial asymmetry.  Psychiatric:        Mood  and Affect: Mood normal.        Speech: Speech normal.        Behavior: Behavior normal.  Thought Content: Thought content normal.        Judgment: Judgment normal.      UC Treatments / Results  Labs (all labs ordered are listed, but only abnormal results are displayed) Labs Reviewed - No data to display  EKG   Radiology No results found.  Procedures Procedures (including critical care time)  Medications Ordered in UC Medications - No data to display  Initial Impression / Assessment and Plan / UC Course  I have reviewed the triage vital signs and the nursing notes.  Pertinent labs & imaging results that were available during my care of the patient were reviewed by me and considered in my medical decision making (see chart for details).   1.  Hypersensitivity reaction, dry lips Suspect hypersensitivity reaction to new toothpaste. No signs of anaphylaxis, perioral dermatitis, contact dermatitis, or infection. Advised to continue use of Vaseline or Aquaphor to the lips to improve symptoms and increased water intake to stay well-hydrated. Discussed return precautions should symptoms worsen. Advised to never use this type of toothpaste again and go back to using previous toothpaste. May follow-up with PCP for ongoing evaluation as needed.  Counseled patient on potential for adverse effects with medications prescribed/recommended today, strict ER and return-to-clinic precautions discussed, patient verbalized understanding.   Patient left prior to receiving AVS.  Final Clinical Impressions(s) / UC Diagnoses   Final diagnoses:  Hypersensitivity reaction, initial encounter  Dry lips   Discharge Instructions   None    ED Prescriptions   None    PDMP not reviewed this encounter.   Enedelia Dorna HERO, FNP 11/25/23 1047    Enedelia Dorna HERO, FNP 11/25/23 1047

## 2023-12-26 ENCOUNTER — Ambulatory Visit (HOSPITAL_BASED_OUTPATIENT_CLINIC_OR_DEPARTMENT_OTHER): Payer: Medicaid Other | Admitting: Family Medicine

## 2023-12-26 ENCOUNTER — Encounter (HOSPITAL_BASED_OUTPATIENT_CLINIC_OR_DEPARTMENT_OTHER): Payer: Self-pay | Admitting: Family Medicine

## 2023-12-26 VITALS — BP 153/95 | HR 85 | Ht 67.0 in | Wt 272.0 lb

## 2023-12-26 DIAGNOSIS — E1165 Type 2 diabetes mellitus with hyperglycemia: Secondary | ICD-10-CM

## 2023-12-26 DIAGNOSIS — R030 Elevated blood-pressure reading, without diagnosis of hypertension: Secondary | ICD-10-CM | POA: Diagnosis not present

## 2023-12-26 LAB — POCT GLYCOSYLATED HEMOGLOBIN (HGB A1C)
HbA1c POC (<> result, manual entry): 8.6 % (ref 4.0–5.6)
Hemoglobin A1C: 8.6 % — AB (ref 4.0–5.6)

## 2023-12-26 NOTE — Patient Instructions (Signed)
[employer] CGM App - Over the counter monitor for patients not on insulin

## 2023-12-26 NOTE — Progress Notes (Signed)
Subjective:   Alexander Mccann 06/21/1996 12/26/2023  Chief Complaint  Patient presents with   Medical Management of Chronic Issues    Patient is here following up on diabetes. Denies any concerns for today's visit. Wants to ask about seeing if he could get [employer].    HPI: Alexander Mccann presents today for re-assessment and management of chronic medical conditions.  DIABETES MELLITUS: Alexander Mccann presents for the medical management of diabetes.  Current diabetes medication regimen: Diet, Exercise (Pcp has recommended medications but patient has never started)  Patient is  adhering to a diabetic diet.  Patient is  exercising regularly.  Patient is not checking BS regularly. Patient is interested in CGM device.  Patient is  checking their feet regularly.  Denies polydipsia, polyphagia, polyuria, open wounds or ulcers on feet.   Lab Results  Component Value Date   HGBA1C 8.6 (A) 12/26/2023   HGBA1C 8.6 12/26/2023    09/25/2023 Lab Results  Component Value Date   MICROALBUR 80 09/25/2023    Wt Readings from Last 3 Encounters:  12/26/23 272 lb (123.4 kg)  11/25/23 250 lb (113.4 kg)  09/25/23 261 lb 8 oz (118.6 kg)     HYPERTENSION: Alexander Mccann presents for the medical management of hypertension.  Patient's current hypertension medication regimen is: None Adhering to low sodium diet: Yes Exercising Regularly: Yes Denies headache, dizziness, CP, SHOB, vision changes.    BP Readings from Last 3 Encounters:  12/26/23 (!) 153/95  11/25/23 (!) 146/89  09/25/23 128/88     The following portions of the patient's history were reviewed and updated as appropriate: past medical history, past surgical history, family history, social history, allergies, medications, and problem list.   Patient Active Problem List   Diagnosis Date Noted   Elevated BP without diagnosis of hypertension 08/14/2023   Type 2 diabetes mellitus with hyperglycemia, without  long-term current use of insulin (HCC) 08/14/2023   VIRAL INFECTION 01/19/2008   ACUTE PHARYNGITIS 01/19/2008   MORBID OBESITY 01/08/2008   INGROWN TOENAIL 01/08/2008   TINEA VERSICOLOR 08/04/2007   HYPOGONADISM 08/04/2007   BRONCHITIS NOS 08/04/2007   Past Medical History:  Diagnosis Date   Diabetes mellitus without complication (HCC)    Obesity    History reviewed. No pertinent surgical history. Family History  Problem Relation Age of Onset   Diabetes Mother    Hypertension Mother    Outpatient Medications Prior to Visit  Medication Sig Dispense Refill   doxycycline (VIBRA-TABS) 100 MG tablet Take 1 tablet (100 mg total) by mouth 2 (two) times daily. 14 tablet 0   ondansetron (ZOFRAN) 4 MG tablet Take 1 tablet (4 mg total) by mouth every 8 (eight) hours as needed for nausea or vomiting. 20 tablet 0   Semaglutide,0.25 or 0.5MG /DOS, (OZEMPIC, 0.25 OR 0.5 MG/DOSE,) 2 MG/3ML SOPN Inject 0.25 mg into the skin once a week for 4 weeks. (Patient not taking: Reported on 12/26/2023) 3 mL 2   No facility-administered medications prior to visit.   No Known Allergies   ROS: A complete ROS was performed with pertinent positives/negatives noted in the HPI. The remainder of the ROS are negative.    Objective:   Today's Vitals   12/26/23 0945  BP: (!) 153/95  Pulse: 85  SpO2: 98%  Weight: 272 lb (123.4 kg)  Height: 5\' 7"  (1.702 m)    Physical Exam          GENERAL: Well-appearing, in NAD. Well nourished.  SKIN:  Pink, warm and dry. No rash, lesion, ulceration, or ecchymoses.  Head: Normocephalic. NECK: Trachea midline. Full ROM w/o pain or tenderness.  RESPIRATORY: Chest wall symmetrical. Respirations even and non-labored. MSK: Muscle tone and strength appropriate for age.   EXTREMITIES: Without clubbing, cyanosis, or edema.  NEUROLOGIC: No motor or sensory deficits. Steady, even gait. C2-C12 intact.  PSYCH/MENTAL STATUS: Alert, oriented x 3. Cooperative, appropriate mood and  affect.     Assessment & Plan:  1. Type 2 diabetes mellitus with hyperglycemia, without long-term current use of insulin (HCC) (Primary) Uncontrolled. A1C increased from 8.0 to 8.6. Pt declines to start medication for control and is aware of risks. He will continue on diet, exercise. Recommended to complete DM eye exam within the year. Recommend return in 4 months for repeat of A1C. Discussed nonprescription CGM Lingo w/ patient as he is not insulin dependent and insurance would not cover.  - POCT glycosylated hemoglobin (Hb A1C)  2. Elevated BP without diagnosis of hypertension PCP has discussed risks of HTN and elevated BP. Patient is adamant that BP is not elevated outside of office but has not provided logs to prove this. Recommend medication, patient has declined. Will continue to monitor and educate on risks.   Lab Orders         POCT glycosylated hemoglobin (Hb A1C)      Return in about 4 months (around 04/24/2024) for DIABETES CHECK UP.    Patient to reach out to office if new, worrisome, or unresolved symptoms arise or if no improvement in patient's condition. Patient verbalized understanding and is agreeable to treatment plan. All questions answered to patient's satisfaction.    Hilbert Bible, [place]

## 2024-05-31 ENCOUNTER — Other Ambulatory Visit: Payer: Self-pay

## 2024-05-31 ENCOUNTER — Encounter (HOSPITAL_BASED_OUTPATIENT_CLINIC_OR_DEPARTMENT_OTHER): Payer: Self-pay

## 2024-05-31 ENCOUNTER — Emergency Department (HOSPITAL_BASED_OUTPATIENT_CLINIC_OR_DEPARTMENT_OTHER)
Admission: EM | Admit: 2024-05-31 | Discharge: 2024-05-31 | Discharge status: Against Medical Advice | Attending: Emergency Medicine | Admitting: Emergency Medicine

## 2024-05-31 DIAGNOSIS — M542 Cervicalgia: Secondary | ICD-10-CM | POA: Diagnosis present

## 2024-05-31 DIAGNOSIS — M545 Low back pain, unspecified: Secondary | ICD-10-CM | POA: Insufficient documentation

## 2024-05-31 DIAGNOSIS — Z5321 Procedure and treatment not carried out due to patient leaving prior to being seen by health care provider: Secondary | ICD-10-CM | POA: Diagnosis not present

## 2024-05-31 NOTE — ED Triage Notes (Signed)
 Pt c/o L sided neck pain x2-3wks ago worse on waking, lower R back pain onset today- stretching stops it but then it comes back. Advises he hasn't been checked up on in awhile

## 2024-06-10 ENCOUNTER — Encounter (HOSPITAL_BASED_OUTPATIENT_CLINIC_OR_DEPARTMENT_OTHER): Payer: Self-pay | Admitting: Family Medicine

## 2024-06-15 ENCOUNTER — Telehealth (HOSPITAL_BASED_OUTPATIENT_CLINIC_OR_DEPARTMENT_OTHER): Payer: Self-pay | Admitting: Family Medicine

## 2024-06-15 NOTE — Telephone Encounter (Signed)
 Pt was seen in the ED. Appt is scheduled for 07/09/24. Alexis, please advise if this is okay or if appt needs to be moved up sooner.

## 2024-06-15 NOTE — Telephone Encounter (Signed)
 Patient called needing a hospital follow up e2c2 is scheduling them for late August but they would like a sooner visit.  Please advise. Thank you!

## 2024-06-16 NOTE — Telephone Encounter (Signed)
 Noted     Closing encounter

## 2024-07-02 ENCOUNTER — Ambulatory Visit (HOSPITAL_BASED_OUTPATIENT_CLINIC_OR_DEPARTMENT_OTHER): Admitting: Family Medicine

## 2024-07-09 ENCOUNTER — Ambulatory Visit (HOSPITAL_BASED_OUTPATIENT_CLINIC_OR_DEPARTMENT_OTHER): Admitting: Family Medicine

## 2024-07-23 ENCOUNTER — Inpatient Hospital Stay (HOSPITAL_BASED_OUTPATIENT_CLINIC_OR_DEPARTMENT_OTHER): Admitting: Family Medicine

## 2024-08-20 ENCOUNTER — Ambulatory Visit (HOSPITAL_BASED_OUTPATIENT_CLINIC_OR_DEPARTMENT_OTHER): Admitting: Family Medicine

## 2024-10-01 ENCOUNTER — Ambulatory Visit (HOSPITAL_BASED_OUTPATIENT_CLINIC_OR_DEPARTMENT_OTHER): Admitting: Family Medicine
